# Patient Record
Sex: Female | Born: 1980
Health system: Southern US, Community
[De-identification: ages and names within clinical notes are randomized; demographics above are authoritative.]

## PROBLEM LIST (undated history)

## (undated) DIAGNOSIS — R011 Cardiac murmur, unspecified: Secondary | ICD-10-CM

## (undated) DIAGNOSIS — K219 Gastro-esophageal reflux disease without esophagitis: Secondary | ICD-10-CM

## (undated) DIAGNOSIS — N879 Dysplasia of cervix uteri, unspecified: Secondary | ICD-10-CM

## (undated) DIAGNOSIS — Z8481 Family history of carrier of genetic disease: Secondary | ICD-10-CM

## (undated) DIAGNOSIS — Z803 Family history of malignant neoplasm of breast: Secondary | ICD-10-CM

## (undated) DIAGNOSIS — Z1371 Encounter for nonprocreative screening for genetic disease carrier status: Secondary | ICD-10-CM

## (undated) DIAGNOSIS — M069 Rheumatoid arthritis, unspecified: Secondary | ICD-10-CM

## (undated) HISTORY — DX: Encounter for nonprocreative screening for genetic disease carrier status: Z13.71

## (undated) HISTORY — DX: Cardiac murmur, unspecified: R01.1

## (undated) HISTORY — DX: Family history of malignant neoplasm of breast: Z80.3

## (undated) HISTORY — DX: Family history of carrier of genetic disease: Z84.81

## (undated) HISTORY — DX: Dysplasia of cervix uteri, unspecified: N87.9

## (undated) HISTORY — PX: LEEP: SHX91

## (undated) HISTORY — DX: Rheumatoid arthritis, unspecified: M06.9

## (undated) HISTORY — DX: Gastro-esophageal reflux disease without esophagitis: K21.9

---

## 2010-01-14 HISTORY — PX: WRIST GANGLION EXCISION: SUR520

## 2018-01-08 LAB — HM PAP SMEAR: HM Pap smear: NEGATIVE

## 2018-05-18 DIAGNOSIS — M2550 Pain in unspecified joint: Secondary | ICD-10-CM | POA: Diagnosis not present

## 2018-05-18 DIAGNOSIS — Z79899 Other long term (current) drug therapy: Secondary | ICD-10-CM | POA: Diagnosis not present

## 2018-05-18 DIAGNOSIS — Z7189 Other specified counseling: Secondary | ICD-10-CM | POA: Diagnosis not present

## 2018-05-18 DIAGNOSIS — M0609 Rheumatoid arthritis without rheumatoid factor, multiple sites: Secondary | ICD-10-CM | POA: Diagnosis not present

## 2018-06-15 ENCOUNTER — Other Ambulatory Visit: Payer: Self-pay

## 2018-06-15 DIAGNOSIS — M0609 Rheumatoid arthritis without rheumatoid factor, multiple sites: Secondary | ICD-10-CM | POA: Diagnosis not present

## 2018-06-16 LAB — CBC WITH DIFFERENTIAL/PLATELET
Basophils Absolute: 0.1 10*3/uL (ref 0.0–0.2)
Basos: 1 %
EOS (ABSOLUTE): 0.3 10*3/uL (ref 0.0–0.4)
Eos: 4 %
Hematocrit: 40.1 % (ref 34.0–46.6)
Hemoglobin: 14.4 g/dL (ref 11.1–15.9)
Immature Grans (Abs): 0 10*3/uL (ref 0.0–0.1)
Immature Granulocytes: 0 %
Lymphocytes Absolute: 2.4 10*3/uL (ref 0.7–3.1)
Lymphs: 30 %
MCH: 34.6 pg — ABNORMAL HIGH (ref 26.6–33.0)
MCHC: 35.9 g/dL — ABNORMAL HIGH (ref 31.5–35.7)
MCV: 96 fL (ref 79–97)
Monocytes Absolute: 0.6 10*3/uL (ref 0.1–0.9)
Monocytes: 8 %
Neutrophils Absolute: 4.6 10*3/uL (ref 1.4–7.0)
Neutrophils: 57 %
Platelets: 227 10*3/uL (ref 150–450)
RBC: 4.16 x10E6/uL (ref 3.77–5.28)
RDW: 12.7 % (ref 11.7–15.4)
WBC: 8.1 10*3/uL (ref 3.4–10.8)

## 2018-06-16 LAB — COMPREHENSIVE METABOLIC PANEL
ALT: 12 IU/L (ref 0–32)
AST: 15 IU/L (ref 0–40)
Albumin/Globulin Ratio: 2.1 (ref 1.2–2.2)
Albumin: 4.6 g/dL (ref 3.8–4.8)
Alkaline Phosphatase: 42 IU/L (ref 39–117)
BUN/Creatinine Ratio: 16 (ref 9–23)
BUN: 12 mg/dL (ref 6–20)
Bilirubin Total: <0.2 mg/dL (ref 0.0–1.2)
CO2: 22 mmol/L (ref 20–29)
Calcium: 9.1 mg/dL (ref 8.7–10.2)
Chloride: 100 mmol/L (ref 96–106)
Creatinine, Ser: 0.75 mg/dL (ref 0.57–1.00)
GFR calc Af Amer: 118 mL/min/{1.73_m2} (ref >59)
GFR calc non Af Amer: 102 mL/min/{1.73_m2} (ref >59)
Globulin, Total: 2.2 g/dL (ref 1.5–4.5)
Glucose: 94 mg/dL (ref 65–99)
Potassium: 3.9 mmol/L (ref 3.5–5.2)
Sodium: 138 mmol/L (ref 134–144)
Total Protein: 6.8 g/dL (ref 6.0–8.5)

## 2018-08-18 DIAGNOSIS — E663 Overweight: Secondary | ICD-10-CM | POA: Diagnosis not present

## 2018-08-18 DIAGNOSIS — Z79899 Other long term (current) drug therapy: Secondary | ICD-10-CM | POA: Diagnosis not present

## 2018-08-18 DIAGNOSIS — Z6827 Body mass index (BMI) 27.0-27.9, adult: Secondary | ICD-10-CM | POA: Diagnosis not present

## 2018-08-18 DIAGNOSIS — Z9229 Personal history of other drug therapy: Secondary | ICD-10-CM | POA: Diagnosis not present

## 2018-08-18 DIAGNOSIS — Z7189 Other specified counseling: Secondary | ICD-10-CM | POA: Diagnosis not present

## 2018-08-18 DIAGNOSIS — M2550 Pain in unspecified joint: Secondary | ICD-10-CM | POA: Diagnosis not present

## 2018-08-18 DIAGNOSIS — M0609 Rheumatoid arthritis without rheumatoid factor, multiple sites: Secondary | ICD-10-CM | POA: Diagnosis not present

## 2018-11-17 DIAGNOSIS — M2550 Pain in unspecified joint: Secondary | ICD-10-CM | POA: Diagnosis not present

## 2018-11-17 DIAGNOSIS — M0609 Rheumatoid arthritis without rheumatoid factor, multiple sites: Secondary | ICD-10-CM | POA: Diagnosis not present

## 2018-11-17 DIAGNOSIS — Z79899 Other long term (current) drug therapy: Secondary | ICD-10-CM | POA: Diagnosis not present

## 2018-11-17 DIAGNOSIS — Z7189 Other specified counseling: Secondary | ICD-10-CM | POA: Diagnosis not present

## 2018-11-17 DIAGNOSIS — Z6827 Body mass index (BMI) 27.0-27.9, adult: Secondary | ICD-10-CM | POA: Diagnosis not present

## 2018-12-25 ENCOUNTER — Other Ambulatory Visit: Payer: Self-pay

## 2018-12-25 ENCOUNTER — Encounter: Payer: Self-pay | Admitting: Primary Care

## 2018-12-25 ENCOUNTER — Ambulatory Visit (INDEPENDENT_AMBULATORY_CARE_PROVIDER_SITE_OTHER): Payer: 59 | Admitting: Primary Care

## 2018-12-25 VITALS — BP 124/74 | HR 64 | Temp 97.4°F | Ht 68.25 in | Wt 176.6 lb

## 2018-12-25 DIAGNOSIS — M069 Rheumatoid arthritis, unspecified: Secondary | ICD-10-CM | POA: Diagnosis not present

## 2018-12-25 DIAGNOSIS — K219 Gastro-esophageal reflux disease without esophagitis: Secondary | ICD-10-CM | POA: Diagnosis not present

## 2018-12-25 DIAGNOSIS — Z1231 Encounter for screening mammogram for malignant neoplasm of breast: Secondary | ICD-10-CM | POA: Diagnosis not present

## 2018-12-25 DIAGNOSIS — Z803 Family history of malignant neoplasm of breast: Secondary | ICD-10-CM | POA: Insufficient documentation

## 2018-12-25 NOTE — Assessment & Plan Note (Signed)
Also in maternal aunt and maternal grandmother with early death in each member.   Will request records. Orders placed for screening mammogram and MRI as she is overdue.

## 2018-12-25 NOTE — Patient Instructions (Signed)
You will be contacted regarding your MRI and mammogram.  Please let us know if you have not been contacted within two weeks.   It was a pleasure to meet you today! Please don't hesitate to call or message me with any questions. Welcome to [facility]!

## 2018-12-25 NOTE — Assessment & Plan Note (Signed)
Diagnosed years ago, doing well on methotrexate. Continue current regimen.

## 2018-12-25 NOTE — Assessment & Plan Note (Signed)
Doing well on omeprazole daily, continue same.

## 2018-12-25 NOTE — Progress Notes (Signed)
Subjective:    Patient ID: Betty Burgess, female    DOB: 1980-09-21, 38 y.o.   MRN: [id number]  HPI  Betty Burgess is a 38 year old female who presents today to establish care and discuss the problems mentioned below. Will obtain/review records.  1) Rheumatoid Arthritis: Diagnosed two years ago. History of chronic daily pain to her hands. Currently managed on methotrexate injections once weekly, previously managed on hydroxychloroquine. Currently following with [facility], seeing Betty Burgess. Feels well managed.   2) GERD: Chronic, intermittent symptoms which include epigastric pain, esophageal reflux. Managed on omeprazole 20 mg daily with significant improvement. No breakthrough symptoms.   3) Family History of Breast Cancer: Family history in mother, maternal aunt, maternal grandmother with early death in all family members. She's undergone genetic cancer testing in [place], all negative. She's undergone MRI and mammogram every 6 months for several years, last imaging was in May 2019.  All negative. She is due now.  Review of Systems  Respiratory: Negative for shortness of breath.   Cardiovascular: Negative for chest pain.  Gastrointestinal:       Denies esophageal reflux  Musculoskeletal: Negative for arthralgias.  Neurological: Negative for dizziness and headaches.       Past Medical History:  Diagnosis Date  . GERD (gastroesophageal reflux disease)   . Heart murmur   . RA (rheumatoid arthritis) (HCC)      Social History   Socioeconomic History  . Marital status: Married    Spouse name: Not on file  . Number of children: Not on file  . Years of education: Not on file  . Highest education level: Not on file  Occupational History  . Not on file  Tobacco Use  . Smoking status: Current Every Day Smoker    Packs/day: 1.00    Types: Cigarettes  . Smokeless tobacco: Never Used  Substance and Sexual Activity  . Alcohol use: Yes  . Drug use: Not on file  .  Sexual activity: Not on file  Other Topics Concern  . Not on file  Social History Narrative   Married.   No children.   2 dogs.   Moved from [place].    Social Determinants of Health   Financial Resource Strain:   . Difficulty of Paying Living Expenses: Not on file  Food Insecurity:   . Worried About Charity fundraiser in the Last Year: Not on file  . Ran Out of Food in the Last Year: Not on file  Transportation Needs:   . Lack of Transportation (Medical): Not on file  . Lack of Transportation (Non-Medical): Not on file  Physical Activity:   . Days of Exercise per Week: Not on file  . Minutes of Exercise per Session: Not on file  Stress:   . Feeling of Stress : Not on file  Social Connections:   . Frequency of Communication with Friends and Family: Not on file  . Frequency of Social Gatherings with Friends and Family: Not on file  . Attends Religious Services: Not on file  . Active Member of Clubs or Organizations: Not on file  . Attends Archivist Meetings: Not on file  . Marital Status: Not on file  Intimate Partner Violence:   . Fear of Current or Ex-Partner: Not on file  . Emotionally Abused: Not on file  . Physically Abused: Not on file  . Sexually Abused: Not on file    Past Surgical History:  Procedure Laterality Date  .  LEEP    . WRIST GANGLION EXCISION  2012    Family History  Problem Relation Age of Onset  . Early death Mother   . Breast cancer Mother 76  . Alcohol abuse Father   . Birth defects Father   . Cancer Father   . Stroke Father   . Drug abuse Maternal Grandmother   . Hyperlipidemia Maternal Grandmother   . Breast cancer Maternal Grandmother 72  . Drug abuse Maternal Grandfather   . Hyperlipidemia Maternal Grandfather   . Arthritis Paternal Grandmother   . Drug abuse Paternal Grandmother   . Stroke Paternal Grandfather   . Breast cancer Maternal Aunt 31  . Hepatitis C Maternal Aunt     Allergies  Allergen Reactions  .  Pertussis Vaccines     Current Outpatient Medications on File Prior to Visit  Medication Sig Dispense Refill  . B-D SYRINGE/NEEDLE 1CC/25GX5/8 25G X 5/8" 1 ML MISC     . folic acid (FOLVITE) 1 MG tablet Take 1 mg by mouth.     . methotrexate 50 MG/2ML injection Inject 50 mg/m2 into the vein once a week.    . Omeprazole 20 MG TBDD Take 20 mg by mouth daily.      No current facility-administered medications on file prior to visit.    BP 124/74   Pulse 64   Temp (!) 97.4 F (36.3 C) (Temporal)   Ht 5' 8.25" (1.734 m)   Wt 176 lb 9 oz (80.1 kg)   SpO2 98%   BMI 26.65 kg/m    Objective:   Physical Exam  Constitutional: She appears well-nourished.  Cardiovascular: Normal rate and regular rhythm.  Respiratory: Effort normal and breath sounds normal.  Musculoskeletal:     Cervical back: Neck supple.  Skin: Skin is warm and dry.  Psychiatric: She has a normal mood and affect.           Assessment & Plan:

## 2019-01-12 ENCOUNTER — Encounter: Payer: Self-pay | Admitting: Primary Care

## 2019-01-14 ENCOUNTER — Encounter: Payer: Self-pay | Admitting: Primary Care

## 2019-01-18 ENCOUNTER — Ambulatory Visit
Admission: RE | Admit: 2019-01-18 | Discharge: 2019-01-18 | Disposition: A | Discharge status: Home / Self Care | Payer: 59 | Source: Ambulatory Visit | Attending: Primary Care | Admitting: Primary Care

## 2019-01-18 DIAGNOSIS — Z1231 Encounter for screening mammogram for malignant neoplasm of breast: Secondary | ICD-10-CM | POA: Insufficient documentation

## 2019-01-18 DIAGNOSIS — Z803 Family history of malignant neoplasm of breast: Secondary | ICD-10-CM | POA: Diagnosis not present

## 2019-01-18 IMAGING — MG DIGITAL SCREENING BILAT W/ TOMO W/ CAD
8 series · 8 of 24 positions shown · non-contrast
Comparison: Previous exam(s).

CLINICAL DATA: Screening.

EXAM:
DIGITAL SCREENING BILATERAL MAMMOGRAM WITH TOMO AND CAD

[R MLO synth-2D]
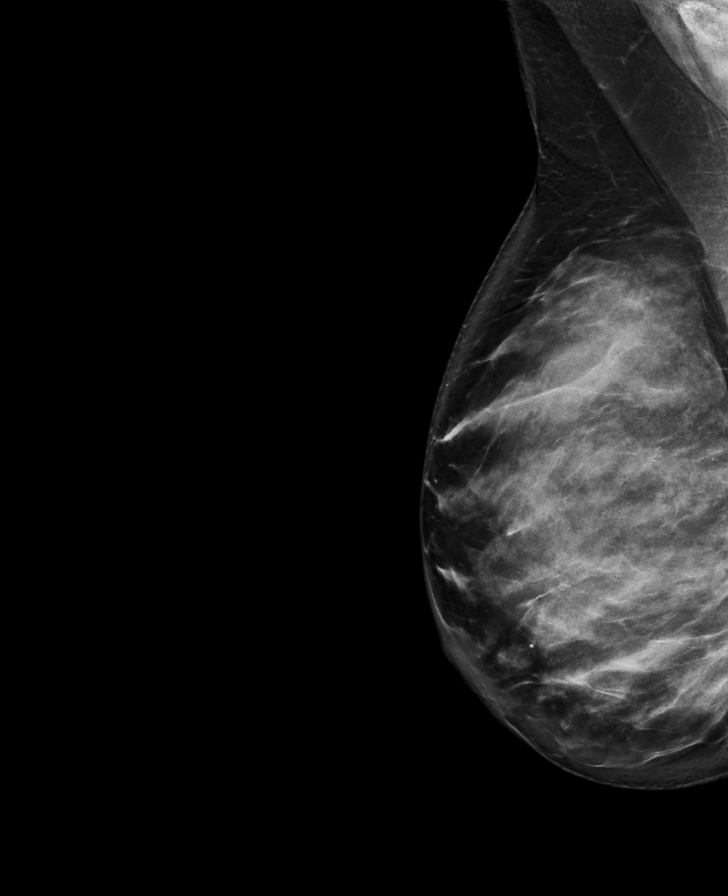

[L MLO synth-2D]
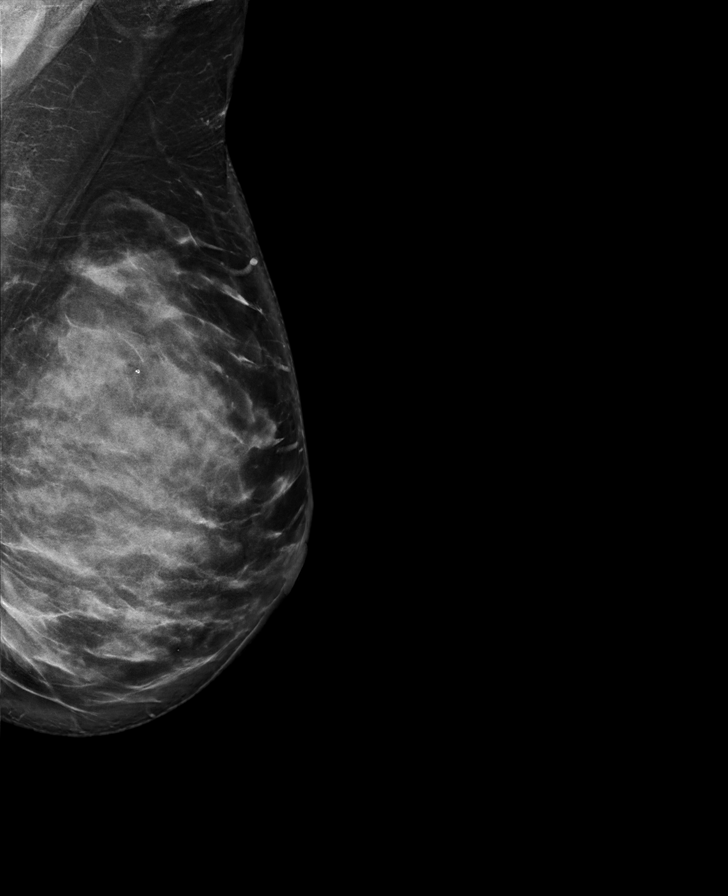

[L CC synth-2D]
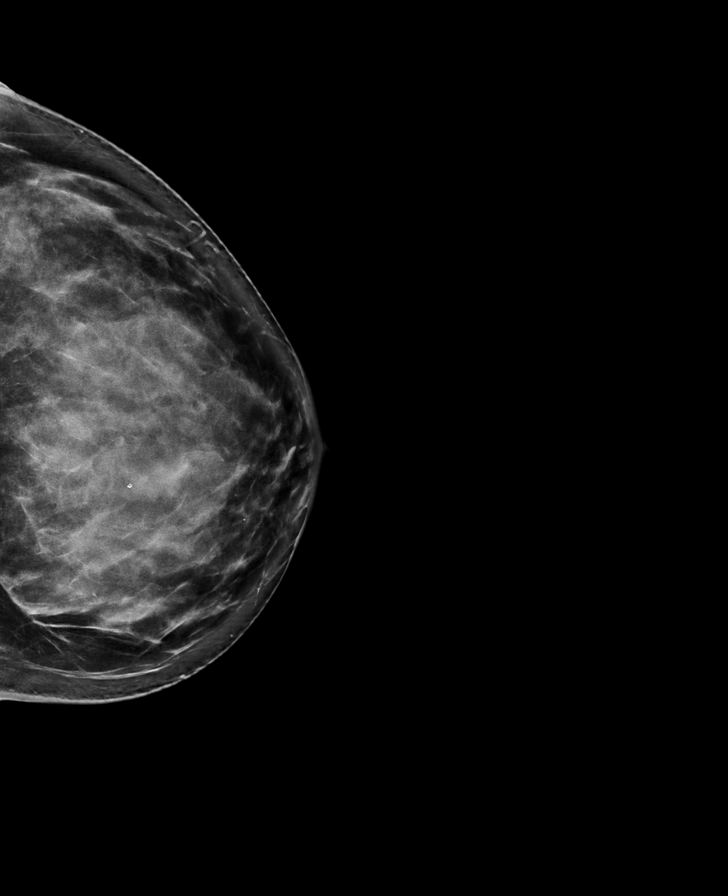

[R CC synth-2D]
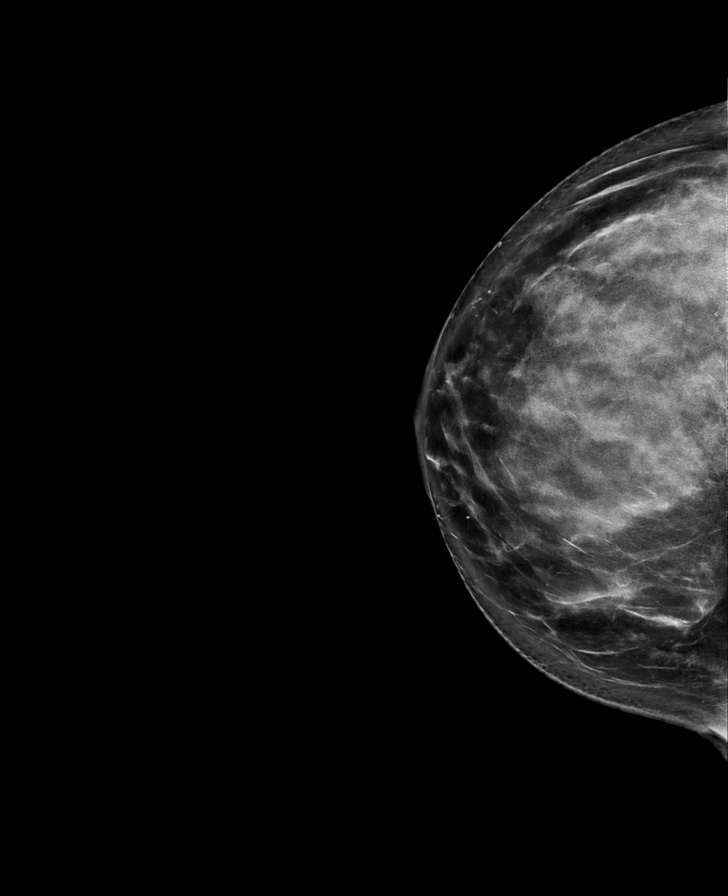

[L CC tomo · tomo slice 46/91.0]
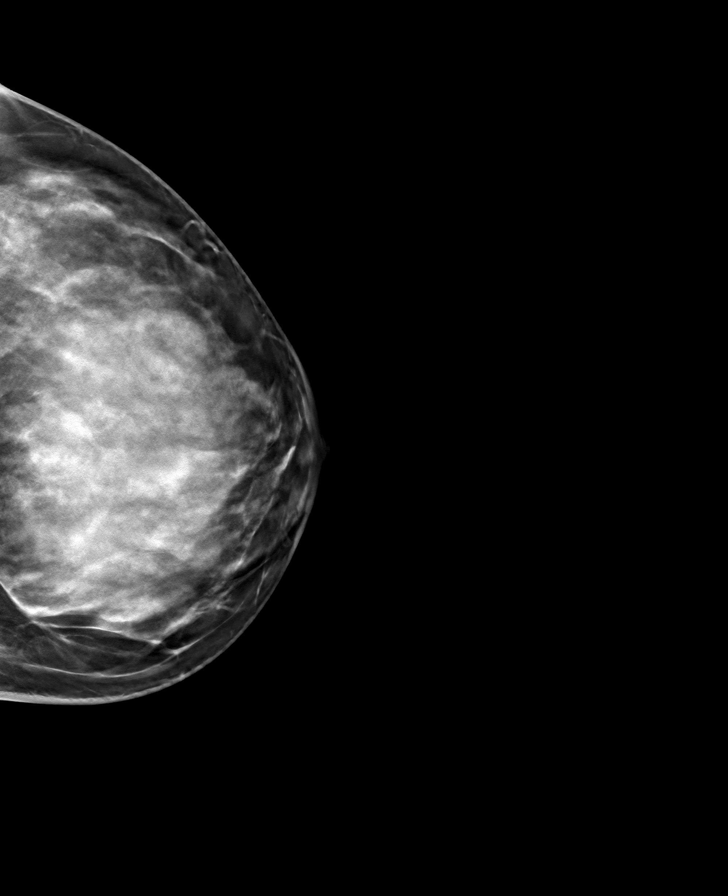

[L MLO tomo · tomo slice 47/92.0]
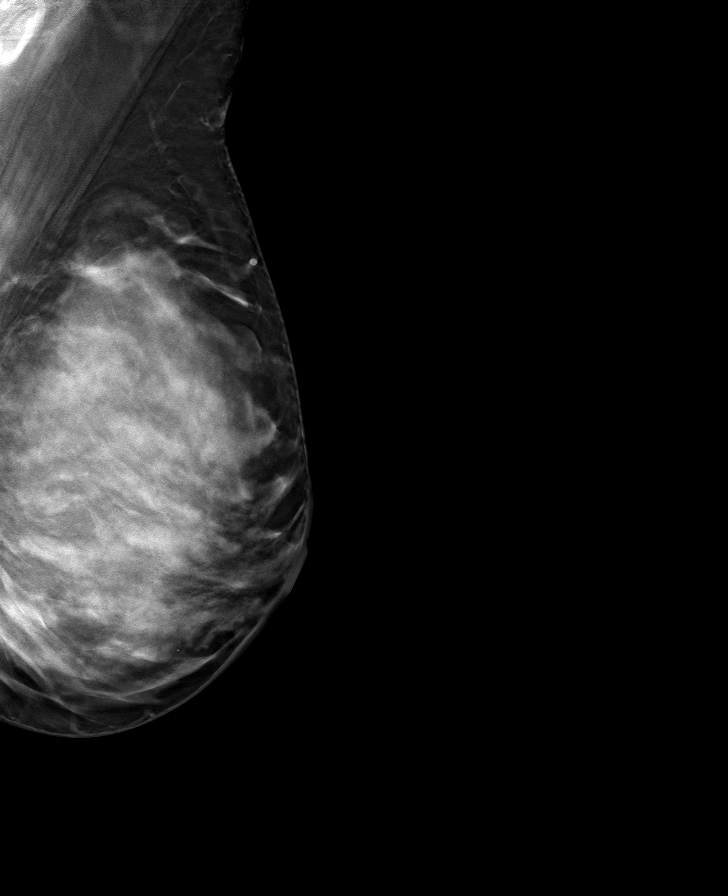

[R MLO tomo · tomo slice 49/98.0]
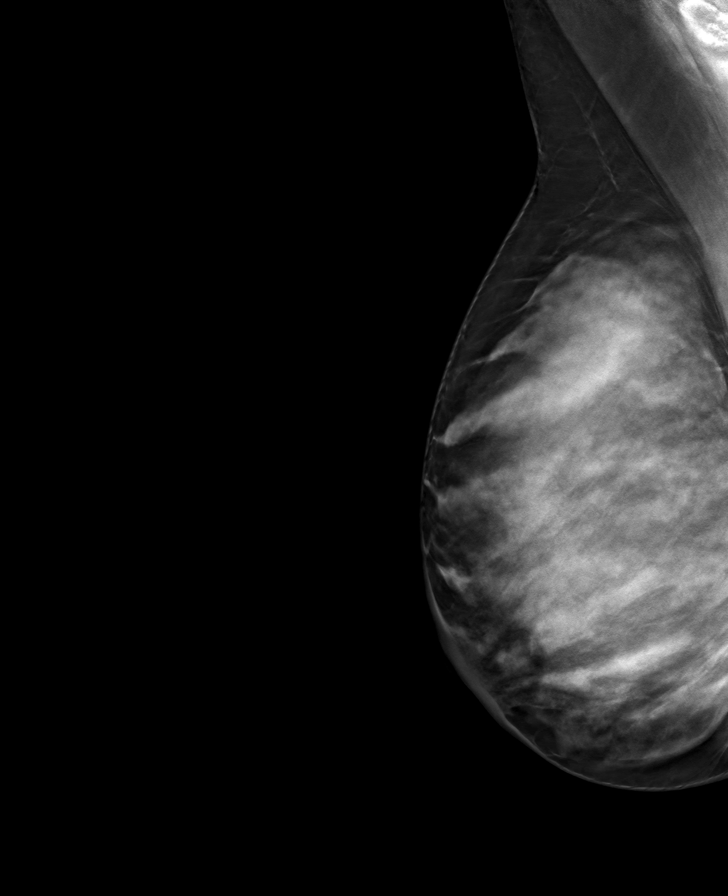

[R CC tomo · tomo slice 47/92.0]
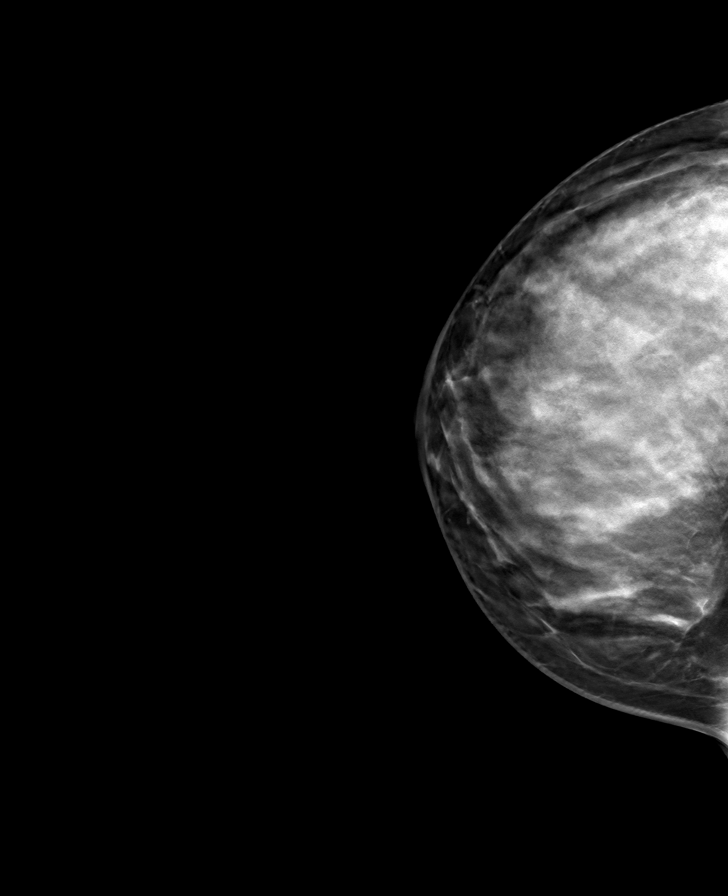

[8 of 24 positions shown; findings below may reference images not displayed]

ACR Breast Density Category d: The breast tissue is extremely dense,
which lowers the sensitivity of mammography
FINDINGS: There are no findings suspicious for malignancy. Images were
processed with CAD.
IMPRESSION: No mammographic evidence of malignancy. A result letter of this
screening mammogram will be mailed directly to the patient.

RECOMMENDATION:
Screening mammogram at age 40. (Code:[48])

BI-RADS CATEGORY  1: Negative.

## 2019-02-17 DIAGNOSIS — M2550 Pain in unspecified joint: Secondary | ICD-10-CM | POA: Diagnosis not present

## 2019-02-17 DIAGNOSIS — Z7189 Other specified counseling: Secondary | ICD-10-CM | POA: Diagnosis not present

## 2019-02-17 DIAGNOSIS — Z79899 Other long term (current) drug therapy: Secondary | ICD-10-CM | POA: Diagnosis not present

## 2019-02-17 DIAGNOSIS — M0609 Rheumatoid arthritis without rheumatoid factor, multiple sites: Secondary | ICD-10-CM | POA: Diagnosis not present

## 2019-02-17 DIAGNOSIS — Z6827 Body mass index (BMI) 27.0-27.9, adult: Secondary | ICD-10-CM | POA: Diagnosis not present

## 2019-05-10 ENCOUNTER — Encounter: Payer: Self-pay | Admitting: Primary Care

## 2019-05-10 ENCOUNTER — Ambulatory Visit (INDEPENDENT_AMBULATORY_CARE_PROVIDER_SITE_OTHER): Payer: 59 | Admitting: Primary Care

## 2019-05-10 VITALS — BP 120/70 | HR 60 | Temp 95.7°F | Ht 68.25 in | Wt 172.1 lb

## 2019-05-10 DIAGNOSIS — Z803 Family history of malignant neoplasm of breast: Secondary | ICD-10-CM

## 2019-05-10 DIAGNOSIS — Z833 Family history of diabetes mellitus: Secondary | ICD-10-CM | POA: Diagnosis not present

## 2019-05-10 DIAGNOSIS — Z0000 Encounter for general adult medical examination without abnormal findings: Secondary | ICD-10-CM

## 2019-05-10 DIAGNOSIS — K219 Gastro-esophageal reflux disease without esophagitis: Secondary | ICD-10-CM | POA: Diagnosis not present

## 2019-05-10 DIAGNOSIS — M069 Rheumatoid arthritis, unspecified: Secondary | ICD-10-CM | POA: Diagnosis not present

## 2019-05-10 MED ORDER — FAMOTIDINE 20 MG PO TABS: 20.0000 mg | ORAL_TABLET | Freq: Every day | ORAL | 0 refills | Status: DC

## 2019-05-10 NOTE — Assessment & Plan Note (Signed)
Improved on methotrexate, following with rheumatology.

## 2019-05-10 NOTE — Assessment & Plan Note (Signed)
Intermittent symptoms, mostly occurring during menstrual cycle. No need for PPI given intermittent symptoms, especially with methotrexate.  Rx for famotidine provide to use as needed. She will update.

## 2019-05-10 NOTE — Progress Notes (Signed)
Subjective:    Patient ID: Betty Burgess, female    DOB: [birth year], 39 y.o.   MRN: [id number]  HPI  This visit occurred during the SARS-CoV-2 public health emergency.  Safety protocols were in place, including screening questions prior to the visit, additional usage of staff PPE, and extensive cleaning of exam room while observing appropriate contact time as indicated for disinfecting solutions.   Betty Burgess is a 39 year old female who presents today for complete physical.  Immunizations: -Tetanus: Unsure  -Influenza: Complete this season  -Covid-19: Completed series   Diet: She endorses a fair diet.  Exercise: She is running 2-3 days weekly, walking during lunch  Eye exam: Completed 1-2 years  Dental exam: Completes regularly   Pap Smear: Completed in 2019 Mammogram: Scheduled for MRI July 2021  BP Readings from Last 3 Encounters:  05/10/19 120/70  12/25/18 124/74    Wt Readings from Last 3 Encounters:  05/10/19 172 lb 1 oz (78 kg)  12/25/18 176 lb 9 oz (80.1 kg)      Review of Systems  Constitutional: Negative for unexpected weight change.  HENT: Negative for rhinorrhea.   Respiratory: Negative for cough and shortness of breath.   Cardiovascular: Negative for chest pain.  Gastrointestinal: Negative for constipation and diarrhea.       GERD  Genitourinary: Negative for difficulty urinating.       IUD in place  Musculoskeletal: Positive for arthralgias.  Skin: Negative for rash.  Allergic/Immunologic: Negative for environmental allergies.  Neurological: Negative for dizziness, numbness and headaches.  Psychiatric/Behavioral: The patient is not nervous/anxious.        Past Medical History:  Diagnosis Date  . GERD (gastroesophageal reflux disease)   . Heart murmur   . RA (rheumatoid arthritis) (HCC)      Social History   Socioeconomic History  . Marital status: Married    Spouse name: Not on file  . Number of children: Not on file  . Years of  education: Not on file  . Highest education level: Not on file  Occupational History  . Not on file  Tobacco Use  . Smoking status: Current Every Day Smoker    Packs/day: 1.00    Types: Cigarettes  . Smokeless tobacco: Never Used  Substance and Sexual Activity  . Alcohol use: Yes  . Drug use: Not on file  . Sexual activity: Not on file  Other Topics Concern  . Not on file  Social History Narrative   Married.   No children.   2 dogs.   Moved from [place].    Social Determinants of Health   Financial Resource Strain:   . Difficulty of Paying Living Expenses:   Food Insecurity:   . Worried About Charity fundraiser in the Last Year:   . Arboriculturist in the Last Year:   Transportation Needs:   . Film/video editor (Medical):   Marland Kitchen Lack of Transportation (Non-Medical):   Physical Activity:   . Days of Exercise per Week:   . Minutes of Exercise per Session:   Stress:   . Feeling of Stress :   Social Connections:   . Frequency of Communication with Friends and Family:   . Frequency of Social Gatherings with Friends and Family:   . Attends Religious Services:   . Active Member of Clubs or Organizations:   . Attends Archivist Meetings:   Marland Kitchen Marital Status:   Intimate Partner Violence:   .  Fear of Current or Ex-Partner:   . Emotionally Abused:   Marland Kitchen Physically Abused:   . Sexually Abused:     Past Surgical History:  Procedure Laterality Date  . LEEP    . WRIST GANGLION EXCISION  2012    Family History  Problem Relation Age of Onset  . Early death Mother   . Breast cancer Mother 75  . Alcohol abuse Father   . Birth defects Father   . Cancer Father   . Stroke Father   . Hyperlipidemia Maternal Grandmother   . Breast cancer Maternal Grandmother 57  . Diabetes Maternal Grandmother   . Hyperlipidemia Maternal Grandfather   . Diabetes Maternal Grandfather   . Arthritis Paternal Grandmother   . Diabetes Paternal Grandmother   . Stroke Paternal  Grandfather   . Breast cancer Maternal Aunt 92  . Hepatitis C Maternal Aunt     Allergies  Allergen Reactions  . Pertussis Vaccines     Current Outpatient Medications on File Prior to Visit  Medication Sig Dispense Refill  . B-D SYRINGE/NEEDLE 1CC/25GX5/8 25G X 5/8" 1 ML MISC     . folic acid (FOLVITE) 1 MG tablet Take 1 mg by mouth.     . methotrexate 50 MG/2ML injection Inject 50 mg/m2 into the vein once a week.    . Omeprazole 20 MG TBDD Take 20 mg by mouth daily.      No current facility-administered medications on file prior to visit.    BP 120/70   Pulse 60   Temp (!) 95.7 F (35.4 C) (Temporal)   Ht 5' 8.25" (1.734 m)   Wt 172 lb 1 oz (78 kg)   SpO2 99%   BMI 25.97 kg/m    Objective:   Physical Exam  Constitutional: She is oriented to person, place, and time. She appears well-nourished.  HENT:  Right Ear: Tympanic membrane and ear canal normal.  Left Ear: Tympanic membrane and ear canal normal.  Mouth/Throat: Oropharynx is clear and moist.  Eyes: Pupils are equal, round, and reactive to light. EOM are normal.  Cardiovascular: Normal rate and regular rhythm.  Respiratory: Effort normal and breath sounds normal.  GI: Soft. Bowel sounds are normal. There is no abdominal tenderness.  Musculoskeletal:        General: Normal range of motion.     Cervical back: Neck supple.  Neurological: She is alert and oriented to person, place, and time. No cranial nerve deficit.  Reflex Scores:      Patellar reflexes are 2+ on the right side and 2+ on the left side. Skin: Skin is warm and dry.  Psychiatric: She has a normal mood and affect.           Assessment & Plan:

## 2019-05-10 NOTE — Patient Instructions (Addendum)
Complete lab work when due.  Continue exercising. You should be getting 150 minutes of moderate intensity exercise weekly.  Continue to work on a healthy diet. Ensure you are consuming 64 ounces of water daily.  You will be contacted regarding your referral to GYN.  Please let us know if you have not been contacted within two weeks.   Take the famotidine daily when needed for heartburn. Update me if this is ineffective.  It was a pleasure to see you today!   Preventive Care 2-40 Years Old, Female Preventive care refers to visits with your health care provider and lifestyle choices that can promote health and wellness. This includes:  A yearly physical exam. This may also be called an annual well check.  Regular dental visits and eye exams.  Immunizations.  Screening for certain conditions.  Healthy lifestyle choices, such as eating a healthy diet, getting regular exercise, not using drugs or products that contain nicotine and tobacco, and limiting alcohol use. What can I expect for my preventive care visit? Physical exam Your health care provider will check your:  Height and weight. This may be used to calculate body mass index (BMI), which tells if you are at a healthy weight.  Heart rate and blood pressure.  Skin for abnormal spots. Counseling Your health care provider may ask you questions about your:  Alcohol, tobacco, and drug use.  Emotional well-being.  Home and relationship well-being.  Sexual activity.  Eating habits.  Work and work Statistician.  Method of birth control.  Menstrual cycle.  Pregnancy history. What immunizations do I need?  Influenza (flu) vaccine  This is recommended every year. Tetanus, diphtheria, and pertussis (Tdap) vaccine  You may need a Td booster every 10 years. Varicella (chickenpox) vaccine  You may need this if you have not been vaccinated. Human papillomavirus (HPV) vaccine  If recommended by your health care  provider, you may need three doses over 6 months. Measles, mumps, and rubella (MMR) vaccine  You may need at least one dose of MMR. You may also need a second dose. Meningococcal conjugate (MenACWY) vaccine  One dose is recommended if you are age 39-21 years and a first-year college student living in a residence hall, or if you have one of several medical conditions. You may also need additional booster doses. Pneumococcal conjugate (PCV13) vaccine  You may need this if you have certain conditions and were not previously vaccinated. Pneumococcal polysaccharide (PPSV23) vaccine  You may need one or two doses if you smoke cigarettes or if you have certain conditions. Hepatitis A vaccine  You may need this if you have certain conditions or if you travel or work in places where you may be exposed to hepatitis A. Hepatitis B vaccine  You may need this if you have certain conditions or if you travel or work in places where you may be exposed to hepatitis B. Haemophilus influenzae type b (Hib) vaccine  You may need this if you have certain conditions. You may receive vaccines as individual doses or as more than one vaccine together in one shot (combination vaccines). Talk with your health care provider about the risks and benefits of combination vaccines. What tests do I need?  Blood tests  Lipid and cholesterol levels. These may be checked every 5 years starting at age 39.  Hepatitis C test.  Hepatitis B test. Screening  Diabetes screening. This is done by checking your blood sugar (glucose) after you have not eaten for a while (fasting).  Sexually transmitted disease (STD) testing.  BRCA-related cancer screening. This may be done if you have a family history of breast, ovarian, tubal, or peritoneal cancers.  Pelvic exam and Pap test. This may be done every 3 years starting at age 39. Starting at age 39, this may be done every 5 years if you have a Pap test in combination with an  HPV test. Talk with your health care provider about your test results, treatment options, and if necessary, the need for more tests. Follow these instructions at home: Eating and drinking   Eat a diet that includes fresh fruits and vegetables, whole grains, lean protein, and low-fat dairy.  Take vitamin and mineral supplements as recommended by your health care provider.  Do not drink alcohol if: ? Your health care provider tells you not to drink. ? You are pregnant, may be pregnant, or are planning to become pregnant.  If you drink alcohol: ? Limit how much you have to 0-1 drink a day. ? Be aware of how much alcohol is in your drink. In the [place] one drink equals one 12 oz bottle of beer (355 mL), one 5 oz glass of wine (148 mL), or one 1 oz glass of hard liquor (44 mL). Lifestyle  Take daily care of your teeth and gums.  Stay active. Exercise for at least 30 minutes on 5 or more days each week.  Do not use any products that contain nicotine or tobacco, such as cigarettes, e-cigarettes, and chewing tobacco. If you need help quitting, ask your health care provider.  If you are sexually active, practice safe sex. Use a condom or other form of birth control (contraception) in order to prevent pregnancy and STIs (sexually transmitted infections). If you plan to become pregnant, see your health care provider for a preconception visit. What's next?  Visit your health care provider once a year for a well check visit.  Ask your health care provider how often you should have your eyes and teeth checked.  Stay up to date on all vaccines. This information is not intended to replace advice given to you by your health care provider. Make sure you discuss any questions you have with your health care provider. Document Revised: 09/11/2017 Document Reviewed: 09/11/2017 Elsevier Patient Education  2020 [employer].

## 2019-05-10 NOTE — Assessment & Plan Note (Signed)
Tetanus UTD, patient will look at records.  Pap smear due, referral placed to GYN per patient request. Encouraged a healthy diet, regular exercise.  Exam today unremarkable. Labs pending.

## 2019-05-10 NOTE — Assessment & Plan Note (Signed)
Mammogram UTD, MR of breasts pending for July 2021.

## 2019-05-17 ENCOUNTER — Other Ambulatory Visit: Payer: Self-pay

## 2019-05-17 ENCOUNTER — Other Ambulatory Visit (INDEPENDENT_AMBULATORY_CARE_PROVIDER_SITE_OTHER): Payer: 59

## 2019-05-17 DIAGNOSIS — Z0000 Encounter for general adult medical examination without abnormal findings: Secondary | ICD-10-CM

## 2019-05-17 DIAGNOSIS — Z7689 Persons encountering health services in other specified circumstances: Secondary | ICD-10-CM | POA: Diagnosis not present

## 2019-05-17 DIAGNOSIS — Z833 Family history of diabetes mellitus: Secondary | ICD-10-CM

## 2019-05-17 LAB — LIPID PANEL
Cholesterol: 171 mg/dL (ref 0–200)
HDL: 45.5 mg/dL (ref >39.00)
LDL Cholesterol: 110 mg/dL — ABNORMAL HIGH (ref 0–99)
NonHDL: 125.43
Total CHOL/HDL Ratio: 4
Triglycerides: 75 mg/dL (ref 0.0–149.0)
VLDL: 15 mg/dL (ref 0.0–40.0)

## 2019-05-17 LAB — HEMOGLOBIN A1C: Hgb A1c MFr Bld: 5.2 % (ref 4.6–6.5)

## 2019-05-18 LAB — CBC WITH DIFFERENTIAL/PLATELET
Basophils Absolute: 0.1 10*3/uL (ref 0.0–0.2)
Basos: 1 %
EOS (ABSOLUTE): 0.1 10*3/uL (ref 0.0–0.4)
Eos: 3 %
Hematocrit: 43 % (ref 34.0–46.6)
Hemoglobin: 14.9 g/dL (ref 11.1–15.9)
Immature Grans (Abs): 0 10*3/uL (ref 0.0–0.1)
Immature Granulocytes: 0 %
Lymphocytes Absolute: 1.8 10*3/uL (ref 0.7–3.1)
Lymphs: 32 %
MCH: 34.4 pg — ABNORMAL HIGH (ref 26.6–33.0)
MCHC: 34.7 g/dL (ref 31.5–35.7)
MCV: 99 fL — ABNORMAL HIGH (ref 79–97)
Monocytes Absolute: 0.6 10*3/uL (ref 0.1–0.9)
Monocytes: 10 %
Neutrophils Absolute: 3.1 10*3/uL (ref 1.4–7.0)
Neutrophils: 54 %
Platelets: 219 10*3/uL (ref 150–450)
RBC: 4.33 x10E6/uL (ref 3.77–5.28)
RDW: 12.3 % (ref 11.7–15.4)
WBC: 5.7 10*3/uL (ref 3.4–10.8)

## 2019-05-18 LAB — COMPREHENSIVE METABOLIC PANEL
ALT: 8 IU/L (ref 0–32)
AST: 11 IU/L (ref 0–40)
Albumin/Globulin Ratio: 2.4 — ABNORMAL HIGH (ref 1.2–2.2)
Albumin: 4.4 g/dL (ref 3.8–4.8)
Alkaline Phosphatase: 39 IU/L (ref 39–117)
BUN/Creatinine Ratio: 12 (ref 9–23)
BUN: 9 mg/dL (ref 6–20)
Bilirubin Total: 0.3 mg/dL (ref 0.0–1.2)
CO2: 22 mmol/L (ref 20–29)
Calcium: 8.7 mg/dL (ref 8.7–10.2)
Chloride: 105 mmol/L (ref 96–106)
Creatinine, Ser: 0.76 mg/dL (ref 0.57–1.00)
GFR calc Af Amer: 115 mL/min/{1.73_m2} (ref >59)
GFR calc non Af Amer: 100 mL/min/{1.73_m2} (ref >59)
Globulin, Total: 1.8 g/dL (ref 1.5–4.5)
Glucose: 102 mg/dL — ABNORMAL HIGH (ref 65–99)
Potassium: 4.2 mmol/L (ref 3.5–5.2)
Sodium: 139 mmol/L (ref 134–144)
Total Protein: 6.2 g/dL (ref 6.0–8.5)

## 2019-05-24 ENCOUNTER — Encounter: Payer: 59 | Admitting: Obstetrics

## 2019-06-01 ENCOUNTER — Encounter: Payer: Self-pay | Admitting: Obstetrics and Gynecology

## 2019-06-01 NOTE — Patient Instructions (Addendum)
I value your feedback and entrusting us with your care. If you get a [facility] patient survey, I would appreciate you taking the time to let us know about your experience today. Thank you!  As of December 24, 2018, your lab results will be released to your MyChart immediately, before I even have a chance to see them. Please give me time to review them and contact you if there are any abnormalities. Thank you for your patience.   [facility]: [phone]  [facility]: [phone]  

## 2019-06-01 NOTE — Progress Notes (Signed)
PCP:  Pleas Koch, NP   Chief Complaint  Patient presents with   Gynecologic Exam     HPI:      [name] is a 39 y.o. No obstetric history on file. whose LMP was No LMP recorded. (Menstrual status: IUD)., presents today for her NP annual examination.  Her menses are absent with IUD. Has occas spotting, no dysmen.  Sex activity: single partner, contraception - IUD. Mirena placed less than 6 yrs ago. Pt plans to check on insertion date, done in [place].  Last Pap: 01/02/18  Results were: no abnormalities /neg HPV DNA . S/P LEEP less than 10 yrs ago. Had neg margins. Does regular paps and normal paps since.  Hx of STDs: HPV  Last mammogram: 01/18/19  Results were: normal--routine follow-up in 12 months. Does yearly scr breast MRI and has appt 7/21.  There is a FH of breast cancer in her mom, MGM, and mat aunt. Genetic testing has been done. Her MGM was BRCA 1 positive. Pt is 24 gene panel neg. Still at increased risk based on FH per GC.  There is no FH of ovarian cancer. The patient does not do self-breast exams.  Tobacco use: smokes 1/2 ppd, down from 1 ppd Alcohol use: none No drug use.  Exercise: very active  She does get adequate calcium but not Vitamin D in her diet. Labs with PCP   Past Medical History:  Diagnosis Date   BRCA negative    24 gene panel neg   Cervical dysplasia    Family history of BRCA1 gene positive    MGM   Family history of breast cancer    GERD (gastroesophageal reflux disease)    Heart murmur    RA (rheumatoid arthritis) ([place])     Past Surgical History:  Procedure Laterality Date   LEEP  ~2015   WRIST GANGLION EXCISION  2012    Family History  Problem Relation Age of Onset   Early death Mother    Breast cancer Mother 90       BRCA neg   Alcohol abuse Father    Birth defects Father    Cancer Father    Stroke Father    Hyperlipidemia Maternal Grandmother    Breast cancer Maternal Grandmother 31        BRCA 1 pos   Diabetes Maternal Grandmother    Hyperlipidemia Maternal Grandfather    Diabetes Maternal Grandfather    Arthritis Paternal Grandmother    Diabetes Paternal Grandmother    Stroke Paternal Grandfather    Breast cancer Maternal Aunt 3       BRCA neg   Hepatitis C Maternal Aunt     Social History   Socioeconomic History   Marital status: Married    Spouse name: Not on file   Number of children: Not on file   Years of education: Not on file   Highest education level: Not on file  Occupational History   Not on file  Tobacco Use   Smoking status: Current Every Day Smoker    Packs/day: 1.00    Types: Cigarettes   Smokeless tobacco: Never Used  Substance and Sexual Activity   Alcohol use: Yes   Drug use: Never   Sexual activity: Yes    Birth control/protection: I.U.D.    Comment: Mirena  Other Topics Concern   Not on file  Social History Narrative   Married.   No children.   2 dogs.  Moved from [place].    Social Determinants of Health   Financial Resource Strain:    Difficulty of Paying Living Expenses:   Food Insecurity:    Worried About Charity fundraiser in the Last Year:    Arboriculturist in the Last Year:   Transportation Needs:    Film/video editor (Medical):    Lack of Transportation (Non-Medical):   Physical Activity:    Days of Exercise per Week:    Minutes of Exercise per Session:   Stress:    Feeling of Stress :   Social Connections:    Frequency of Communication with Friends and Family:    Frequency of Social Gatherings with Friends and Family:    Attends Religious Services:    Active Member of Clubs or Organizations:    Attends Music therapist:    Marital Status:   Intimate Partner Violence:    Fear of Current or Ex-Partner:    Emotionally Abused:    Physically Abused:    Sexually Abused:      Current Outpatient Medications:    B-D SYRINGE/NEEDLE 1CC/25GX5/8 25G X  5/8" 1 ML MISC, , Disp: , Rfl:    famotidine (PEPCID) 20 MG tablet, Take 1 tablet (20 mg total) by mouth daily. As needed for heartburn., Disp: 90 tablet, Rfl: 0   folic acid (FOLVITE) 1 MG tablet, Take 1 mg by mouth. , Disp: , Rfl:    levonorgestrel (MIRENA) 20 MCG/24HR IUD, 1 each by Intrauterine route once. Pt thinks insertion date was in 2017 or 2018, Disp: , Rfl:    methotrexate 50 MG/2ML injection, Inject 50 mg/m2 into the vein once a week., Disp: , Rfl:    ROS:  Review of Systems  Constitutional: Negative for fatigue, fever and unexpected weight change.  Respiratory: Negative for cough, shortness of breath and wheezing.   Cardiovascular: Negative for chest pain, palpitations and leg swelling.  Gastrointestinal: Negative for blood in stool, constipation, diarrhea, nausea and vomiting.  Endocrine: Negative for cold intolerance, heat intolerance and polyuria.  Genitourinary: Negative for dyspareunia, dysuria, flank pain, frequency, genital sores, hematuria, menstrual problem, pelvic pain, urgency, vaginal bleeding, vaginal discharge and vaginal pain.  Musculoskeletal: Negative for back pain, joint swelling and myalgias.  Skin: Negative for rash.  Neurological: Negative for dizziness, syncope, light-headedness, numbness and headaches.  Hematological: Negative for adenopathy.  Psychiatric/Behavioral: Negative for agitation, confusion, sleep disturbance and suicidal ideas. The patient is not nervous/anxious.   BREAST: No symptoms   Objective: BP 98/80    Ht '5\' 9"'  (1.753 m)    Wt 173 lb (78.5 kg)    BMI 25.55 kg/m    Physical Exam Constitutional:      Appearance: She is well-developed.  Genitourinary:     Vulva, vagina, cervix, uterus, right adnexa and left adnexa normal.     No vulval lesion or tenderness noted.     No vaginal discharge, erythema or tenderness.     No cervical polyp.     IUD strings visualized.     Uterus is not enlarged or tender.     No right or left  adnexal mass present.     Right adnexa not tender.     Left adnexa not tender.  Neck:     Thyroid: No thyromegaly.  Cardiovascular:     Rate and Rhythm: Normal rate and regular rhythm.     Heart sounds: Normal heart sounds. No murmur.  Pulmonary:     Effort:  Pulmonary effort is normal.     Breath sounds: Normal breath sounds.  Chest:     Breasts:        Right: No mass, nipple discharge, skin change or tenderness.        Left: No mass, nipple discharge, skin change or tenderness.  Abdominal:     Palpations: Abdomen is soft.     Tenderness: There is no abdominal tenderness. There is no guarding.  Musculoskeletal:        General: Normal range of motion.     Cervical back: Normal range of motion.  Neurological:     General: No focal deficit present.     Mental Status: She is alert and oriented to person, place, and time.     Cranial Nerves: No cranial nerve deficit.  Skin:    General: Skin is warm and dry.  Psychiatric:        Mood and Affect: Mood normal.        Behavior: Behavior normal.        Thought Content: Thought content normal.        Judgment: Judgment normal.  Vitals reviewed.     Assessment/Plan: Encounter for annual routine gynecological examination  Cervical cancer screening - Plan: Cytology - PAP  Screening for HPV (human papillomavirus) - Plan: Cytology - PAP  Cervical dysplasia - Plan: Cytology - PAP; repeat pap today.  Encounter for routine checking of intrauterine contraceptive device (IUD)--IUD strings in place. Pt to confirm insertion date. Good for 25yr  Family history of breast cancer--MGM is BRCA 1 pos; pt is BRCA neg. Does yearly mammos and scr breast MRI. Add Vit D supp.             GYN counsel breast self exam, mammography screening, adequate intake of calcium and vitamin D, diet and exercise, tobacco cessation     F/U  Return in about 1 year (around 06/01/2020).  Addalynne Golding B. Jaan Fischel, PA-C 06/02/2019 10:45 AM

## 2019-06-02 ENCOUNTER — Other Ambulatory Visit: Payer: Self-pay

## 2019-06-02 ENCOUNTER — Encounter: Payer: Self-pay | Admitting: Obstetrics and Gynecology

## 2019-06-02 ENCOUNTER — Other Ambulatory Visit (HOSPITAL_COMMUNITY)
Admission: RE | Admit: 2019-06-02 | Discharge: 2019-06-02 | Disposition: A | Discharge status: Home / Self Care | Payer: 59 | Source: Ambulatory Visit | Attending: Obstetrics | Admitting: Obstetrics

## 2019-06-02 ENCOUNTER — Ambulatory Visit (INDEPENDENT_AMBULATORY_CARE_PROVIDER_SITE_OTHER): Payer: 59 | Admitting: Obstetrics and Gynecology

## 2019-06-02 VITALS — BP 98/80 | Ht 69.0 in | Wt 173.0 lb

## 2019-06-02 DIAGNOSIS — Z803 Family history of malignant neoplasm of breast: Secondary | ICD-10-CM | POA: Diagnosis not present

## 2019-06-02 DIAGNOSIS — Z01419 Encounter for gynecological examination (general) (routine) without abnormal findings: Secondary | ICD-10-CM

## 2019-06-02 DIAGNOSIS — N879 Dysplasia of cervix uteri, unspecified: Secondary | ICD-10-CM | POA: Insufficient documentation

## 2019-06-02 DIAGNOSIS — Z1151 Encounter for screening for human papillomavirus (HPV): Secondary | ICD-10-CM | POA: Diagnosis not present

## 2019-06-02 DIAGNOSIS — Z30431 Encounter for routine checking of intrauterine contraceptive device: Secondary | ICD-10-CM | POA: Diagnosis not present

## 2019-06-02 DIAGNOSIS — Z124 Encounter for screening for malignant neoplasm of cervix: Secondary | ICD-10-CM | POA: Insufficient documentation

## 2019-06-03 LAB — CYTOLOGY - PAP
Adequacy: ABSENT
Comment: NEGATIVE
Diagnosis: NEGATIVE
High risk HPV: NEGATIVE

## 2019-07-10 ENCOUNTER — Encounter: Payer: Self-pay | Admitting: Obstetrics and Gynecology

## 2019-07-20 ENCOUNTER — Ambulatory Visit: Payer: 59

## 2019-07-26 ENCOUNTER — Ambulatory Visit
Admission: RE | Admit: 2019-07-26 | Discharge: 2019-07-26 | Disposition: A | Discharge status: Home / Self Care | Payer: 59 | Source: Ambulatory Visit | Attending: Primary Care | Admitting: Primary Care

## 2019-07-26 ENCOUNTER — Other Ambulatory Visit: Payer: Self-pay

## 2019-07-26 DIAGNOSIS — Z1231 Encounter for screening mammogram for malignant neoplasm of breast: Secondary | ICD-10-CM | POA: Diagnosis not present

## 2019-07-26 DIAGNOSIS — N6002 Solitary cyst of left breast: Secondary | ICD-10-CM | POA: Diagnosis not present

## 2019-07-26 DIAGNOSIS — Z803 Family history of malignant neoplasm of breast: Secondary | ICD-10-CM | POA: Diagnosis not present

## 2019-07-26 DIAGNOSIS — N6001 Solitary cyst of right breast: Secondary | ICD-10-CM | POA: Diagnosis not present

## 2019-07-26 IMAGING — MR MR BREAST BILAT WO/W CM
2 of 9 series · 6 of 48 positions shown · IV contrast (gadavist)
Comparison: [DATE] MR, [DATE] mammogram and prior studies

CLINICAL DATA: 38-year-old female with high lifetime risk for
developing breast cancer for screening breast MRI.

LABS:  None performed today
EXAM:
BILATERAL BREAST MRI WITH AND WITHOUT CONTRAST
TECHNIQUE: Multiplanar, multisequence MR images of both breasts were obtained
prior to and following the intravenous administration of 7 ml of
Gadavist

[Series 2: T1 · axial · B · 1.5mm · 1.02mm/px · z∈[-114,+52]mm · 5 of 112 slices shown]
[im 1/112]
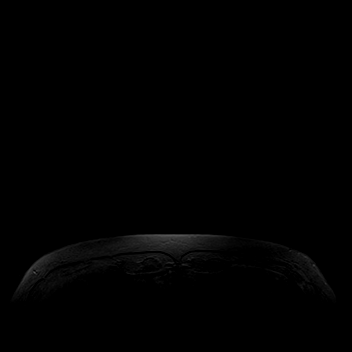
[im 28/112]
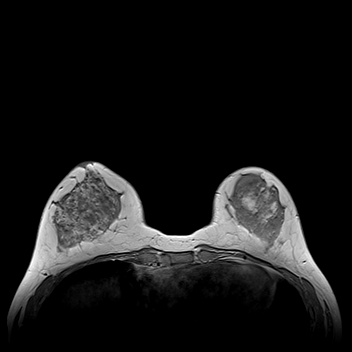
[im 56/112]
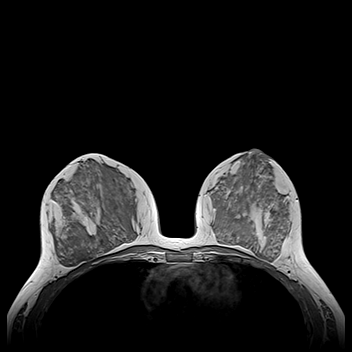
[im 84/112]
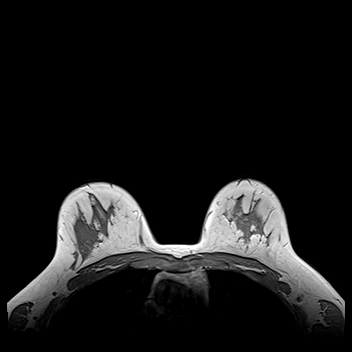
[im 112/112]
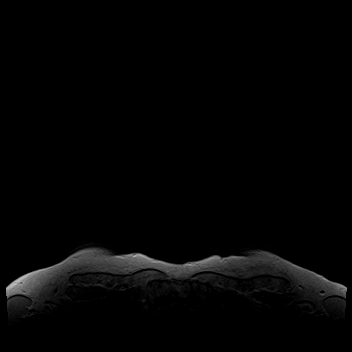

[Series 3: T2 · axial · B · 3.0mm · 1.02mm/px · 1 of 46 slices shown]
[im 1/46]
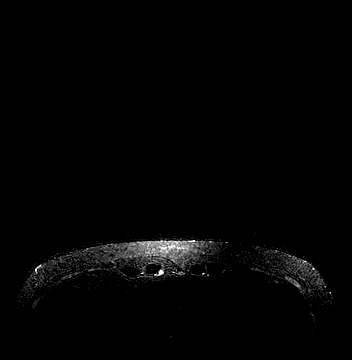

[6 of 48 positions shown; findings below may reference images not displayed]

Three-dimensional MR images were rendered by post-processing of the
original MR data on an independent workstation. The
three-dimensional MR images were interpreted, and findings are
reported in the following complete MRI report for this study. Three
dimensional images were evaluated at the independent Dyna[employer]
workstation
FINDINGS: Breast composition: d. Extreme fibroglandular tissue.

Background parenchymal enhancement: Moderate.

Right breast: No suspicious mass or abnormal enhancement. Small
scattered cysts are present.

Left breast: No suspicious mass or abnormal enhancement. Small
scattered cysts are present.

Lymph nodes: No abnormal appearing lymph nodes.

Ancillary findings:  None.
IMPRESSION: No MR evidence of breast malignancy.

Small scattered cysts within both breasts.

RECOMMENDATION:
Bilateral screening breast MRI in 1 year in this high risk patient.

Bilateral screening mammogram in [DATE] to resume annual
mammogram schedule.

BI-RADS CATEGORY  2: Benign.

## 2019-07-26 MED ORDER — GADOBUTROL 1 MMOL/ML IV SOLN
7.0000 mL | Freq: Once | INTRAVENOUS | Status: AC | PRN
  Administered 2019-07-26: 7 mL via INTRAVENOUS

## 2019-08-17 DIAGNOSIS — M2550 Pain in unspecified joint: Secondary | ICD-10-CM | POA: Diagnosis not present

## 2019-08-17 DIAGNOSIS — Z7189 Other specified counseling: Secondary | ICD-10-CM | POA: Diagnosis not present

## 2019-08-17 DIAGNOSIS — M0609 Rheumatoid arthritis without rheumatoid factor, multiple sites: Secondary | ICD-10-CM | POA: Diagnosis not present

## 2019-08-17 DIAGNOSIS — Z6827 Body mass index (BMI) 27.0-27.9, adult: Secondary | ICD-10-CM | POA: Diagnosis not present

## 2019-08-17 DIAGNOSIS — E663 Overweight: Secondary | ICD-10-CM | POA: Diagnosis not present

## 2019-08-17 DIAGNOSIS — Z79899 Other long term (current) drug therapy: Secondary | ICD-10-CM | POA: Diagnosis not present

## 2020-02-17 DIAGNOSIS — Z7189 Other specified counseling: Secondary | ICD-10-CM | POA: Diagnosis not present

## 2020-02-17 DIAGNOSIS — M2550 Pain in unspecified joint: Secondary | ICD-10-CM | POA: Diagnosis not present

## 2020-02-17 DIAGNOSIS — E663 Overweight: Secondary | ICD-10-CM | POA: Diagnosis not present

## 2020-02-17 DIAGNOSIS — M0609 Rheumatoid arthritis without rheumatoid factor, multiple sites: Secondary | ICD-10-CM | POA: Diagnosis not present

## 2020-02-17 DIAGNOSIS — Z6826 Body mass index (BMI) 26.0-26.9, adult: Secondary | ICD-10-CM | POA: Diagnosis not present

## 2020-02-17 DIAGNOSIS — Z79899 Other long term (current) drug therapy: Secondary | ICD-10-CM | POA: Diagnosis not present

## 2020-03-08 DIAGNOSIS — M955 Acquired deformity of pelvis: Secondary | ICD-10-CM | POA: Diagnosis not present

## 2020-03-08 DIAGNOSIS — M9905 Segmental and somatic dysfunction of pelvic region: Secondary | ICD-10-CM | POA: Diagnosis not present

## 2020-03-08 DIAGNOSIS — M62830 Muscle spasm of back: Secondary | ICD-10-CM | POA: Diagnosis not present

## 2020-03-08 DIAGNOSIS — M9903 Segmental and somatic dysfunction of lumbar region: Secondary | ICD-10-CM | POA: Diagnosis not present

## 2020-03-09 DIAGNOSIS — M9903 Segmental and somatic dysfunction of lumbar region: Secondary | ICD-10-CM | POA: Diagnosis not present

## 2020-03-09 DIAGNOSIS — M62830 Muscle spasm of back: Secondary | ICD-10-CM | POA: Diagnosis not present

## 2020-03-09 DIAGNOSIS — M955 Acquired deformity of pelvis: Secondary | ICD-10-CM | POA: Diagnosis not present

## 2020-03-09 DIAGNOSIS — M9905 Segmental and somatic dysfunction of pelvic region: Secondary | ICD-10-CM | POA: Diagnosis not present

## 2020-03-13 DIAGNOSIS — M9903 Segmental and somatic dysfunction of lumbar region: Secondary | ICD-10-CM | POA: Diagnosis not present

## 2020-03-13 DIAGNOSIS — M955 Acquired deformity of pelvis: Secondary | ICD-10-CM | POA: Diagnosis not present

## 2020-03-13 DIAGNOSIS — M9905 Segmental and somatic dysfunction of pelvic region: Secondary | ICD-10-CM | POA: Diagnosis not present

## 2020-03-13 DIAGNOSIS — M62830 Muscle spasm of back: Secondary | ICD-10-CM | POA: Diagnosis not present

## 2020-03-15 DIAGNOSIS — M955 Acquired deformity of pelvis: Secondary | ICD-10-CM | POA: Diagnosis not present

## 2020-03-15 DIAGNOSIS — M9905 Segmental and somatic dysfunction of pelvic region: Secondary | ICD-10-CM | POA: Diagnosis not present

## 2020-03-15 DIAGNOSIS — M9903 Segmental and somatic dysfunction of lumbar region: Secondary | ICD-10-CM | POA: Diagnosis not present

## 2020-03-15 DIAGNOSIS — M62830 Muscle spasm of back: Secondary | ICD-10-CM | POA: Diagnosis not present

## 2020-04-11 NOTE — Telephone Encounter (Signed)
Um hi Jonelle Sidle?

## 2020-05-11 ENCOUNTER — Encounter: Payer: 59 | Admitting: Primary Care

## 2020-05-17 ENCOUNTER — Encounter: Payer: 59 | Admitting: Primary Care

## 2020-06-01 ENCOUNTER — Other Ambulatory Visit: Payer: Self-pay

## 2020-06-01 ENCOUNTER — Encounter: Payer: Self-pay | Admitting: Primary Care

## 2020-06-01 ENCOUNTER — Encounter: Payer: Self-pay | Admitting: Obstetrics and Gynecology

## 2020-06-01 ENCOUNTER — Ambulatory Visit (INDEPENDENT_AMBULATORY_CARE_PROVIDER_SITE_OTHER): Payer: 59 | Admitting: Primary Care

## 2020-06-01 VITALS — BP 108/74 | HR 68 | Temp 98.7°F | Ht 69.0 in | Wt 171.0 lb

## 2020-06-01 DIAGNOSIS — Z803 Family history of malignant neoplasm of breast: Secondary | ICD-10-CM

## 2020-06-01 DIAGNOSIS — Z0000 Encounter for general adult medical examination without abnormal findings: Secondary | ICD-10-CM | POA: Diagnosis not present

## 2020-06-01 DIAGNOSIS — M069 Rheumatoid arthritis, unspecified: Secondary | ICD-10-CM

## 2020-06-01 DIAGNOSIS — K219 Gastro-esophageal reflux disease without esophagitis: Secondary | ICD-10-CM

## 2020-06-01 DIAGNOSIS — Z1231 Encounter for screening mammogram for malignant neoplasm of breast: Secondary | ICD-10-CM

## 2020-06-01 NOTE — Assessment & Plan Note (Signed)
No recent symptoms or use of famotidine.  Continue to monitor.

## 2020-06-01 NOTE — Assessment & Plan Note (Signed)
Chronic, stable and doing well on methotrexate 25 mg weekly, follows with rheumatology. Continue current regimen.

## 2020-06-01 NOTE — Assessment & Plan Note (Signed)
Tetanus due, will provide today. Mammogram due and pending. Pap smear UTD.  Commended her on dietary changes and regular exercise.   Exam today unremarkable. Labs pending.

## 2020-06-01 NOTE — Patient Instructions (Signed)
Complete your labs as discussed.  [facility] to schedule your [facility].   It was a pleasure to see you today!   Preventive Care 71-40 Years Old, Female Preventive care refers to lifestyle choices and visits with your health care provider that can promote health and wellness. This includes:  A yearly physical exam. This is also called an annual wellness visit.  Regular dental and eye exams.  Immunizations.  Screening for certain conditions.  Healthy lifestyle choices, such as: ? Eating a healthy diet. ? Getting regular exercise. ? Not using drugs or products that contain nicotine and tobacco. ? Limiting alcohol use. What can I expect for my preventive care visit? Physical exam Your health care provider may check your:  Height and weight. These may be used to calculate your BMI (body mass index). BMI is a measurement that tells if you are at a healthy weight.  Heart rate and blood pressure.  Body temperature.  Skin for abnormal spots. Counseling Your health care provider may ask you questions about your:  Past medical problems.  Family's medical history.  Alcohol, tobacco, and drug use.  Emotional well-being.  Home life and relationship well-being.  Sexual activity.  Diet, exercise, and sleep habits.  Work and work Statistician.  Access to firearms.  Method of birth control.  Menstrual cycle.  Pregnancy history. What immunizations do I need? Vaccines are usually given at various ages, according to a schedule. Your health care provider will recommend vaccines for you based on your age, medical history, and lifestyle or other factors, such as travel or where you work.   What tests do I need? Blood tests  Lipid and cholesterol levels. These may be checked every 5 years starting at age 85.  Hepatitis C test.  Hepatitis B test. Screening  Diabetes screening. This is done by checking your blood sugar (glucose) after you have not eaten for a  while (fasting).  STD (sexually transmitted disease) testing, if you are at risk.  BRCA-related cancer screening. This may be done if you have a family history of breast, ovarian, tubal, or peritoneal cancers.  Pelvic exam and Pap test. This may be done every 3 years starting at age 73. Starting at age 77, this may be done every 5 years if you have a Pap test in combination with an HPV test. Talk with your health care provider about your test results, treatment options, and if necessary, the need for more tests.   Follow these instructions at home: Eating and drinking  Eat a healthy diet that includes fresh fruits and vegetables, whole grains, lean protein, and low-fat dairy products.  Take vitamin and mineral supplements as recommended by your health care provider.  Do not drink alcohol if: ? Your health care provider tells you not to drink. ? You are pregnant, may be pregnant, or are planning to become pregnant.  If you drink alcohol: ? Limit how much you have to 0-1 drink a day. ? Be aware of how much alcohol is in your drink. In the [place] one drink equals one 12 oz bottle of beer (355 mL), one 5 oz glass of wine (148 mL), or one 1 oz glass of hard liquor (44 mL).   Lifestyle  Take daily care of your teeth and gums. Brush your teeth every morning and night with fluoride toothpaste. Floss one time each day.  Stay active. Exercise for at least 30 minutes 5 or more days each week.  Do not use any  products that contain nicotine or tobacco, such as cigarettes, e-cigarettes, and chewing tobacco. If you need help quitting, ask your health care provider.  Do not use drugs.  If you are sexually active, practice safe sex. Use a condom or other form of protection to prevent STIs (sexually transmitted infections).  If you do not wish to become pregnant, use a form of birth control. If you plan to become pregnant, see your health care provider for a prepregnancy visit.  Find healthy ways  to cope with stress, such as: ? Meditation, yoga, or listening to music. ? Journaling. ? Talking to a trusted person. ? Spending time with friends and family. Safety  Always wear your seat belt while driving or riding in a vehicle.  Do not drive: ? If you have been drinking alcohol. Do not ride with someone who has been drinking. ? When you are tired or distracted. ? While texting.  Wear a helmet and other protective equipment during sports activities.  If you have firearms in your house, make sure you follow all gun safety procedures.  Seek help if you have been physically or sexually abused. What's next?  Go to your health care provider once a year for an annual wellness visit.  Ask your health care provider how often you should have your eyes and teeth checked.  Stay up to date on all vaccines. This information is not intended to replace advice given to you by your health care provider. Make sure you discuss any questions you have with your health care provider. Document Revised: 08/29/2019 Document Reviewed: 09/11/2017 Elsevier Patient Education  2021 [employer].

## 2020-06-01 NOTE — Progress Notes (Signed)
Subjective:    Patient ID: Betty Burgess, female    DOB: [birth year], 40 y.o.   MRN: [id number]  HPI  Betty Burgess is a very pleasant 40 y.o. female who presents today for complete physical.  She did notice a recent bout of palpitations, checked her apple watch which showed "atrial fibrillation", lasted about 30 seconds. She denies chest pain, SOB, dizziness. Prior to this incident she had run that morning, was also drinking a diet mountain dew, was in the car during the incident.   Immunizations: -Tetanus: Unsure.  -Influenza: Completed this season  -Covid-19: Completed 3 vaccines  Diet: She endorses an improved diet.  Exercise: She is running several days weekly  Eye exam: No recent exam Dental exam:  Completed often   Pap Smear: 2021 Mammogram: 2021, MR breasts in 2021  BP Readings from Last 3 Encounters:  06/01/20 108/74  06/02/19 98/80  05/10/19 120/70   Wt Readings from Last 3 Encounters:  06/01/20 171 lb (77.6 kg)  06/02/19 173 lb (78.5 kg)  05/10/19 172 lb 1 oz (78 kg)      Review of Systems  Constitutional: Negative for unexpected weight change.  HENT: Negative for rhinorrhea.   Eyes: Negative for visual disturbance.  Respiratory: Negative for cough and shortness of breath.   Cardiovascular: Negative for chest pain.       See HPI  Gastrointestinal: Negative for constipation and diarrhea.  Genitourinary: Negative for difficulty urinating.  Musculoskeletal: Negative for arthralgias and myalgias.  Skin: Negative for rash.  Allergic/Immunologic: Negative for environmental allergies.  Neurological: Negative for dizziness, numbness and headaches.  Psychiatric/Behavioral: The patient is not nervous/anxious.          Past Medical History:  Diagnosis Date  . BRCA negative    24 gene panel neg  . Cervical dysplasia   . Family history of BRCA1 gene positive    MGM  . Family history of breast cancer   . GERD (gastroesophageal reflux disease)   .  Heart murmur   . RA (rheumatoid arthritis) ([facility])     Social History   Socioeconomic History  . Marital status: Married    Spouse name: Not on file  . Number of children: Not on file  . Years of education: Not on file  . Highest education level: Not on file  Occupational History  . Not on file  Tobacco Use  . Smoking status: Current Every Day Smoker    Packs/day: 1.00    Types: Cigarettes  . Smokeless tobacco: Never Used  Vaping Use  . Vaping Use: Never used  Substance and Sexual Activity  . Alcohol use: Yes  . Drug use: Never  . Sexual activity: Yes    Birth control/protection: I.U.D.    Comment: Mirena  Other Topics Concern  . Not on file  Social History Narrative   Married.   No children.   2 dogs.   Moved from [place].    Social Determinants of Health   Financial Resource Strain: Not on file  Food Insecurity: Not on file  Transportation Needs: Not on file  Physical Activity: Not on file  Stress: Not on file  Social Connections: Not on file  Intimate Partner Violence: Not on file    Past Surgical History:  Procedure Laterality Date  . LEEP  ~2015  . WRIST GANGLION EXCISION  2012    Family History  Problem Relation Age of Onset  . Early death Mother   . Breast cancer Mother  48       BRCA neg  . Alcohol abuse Father   . Birth defects Father   . Cancer Father   . Stroke Father   . Hyperlipidemia Maternal Grandmother   . Breast cancer Maternal Grandmother 77       BRCA 1 pos  . Diabetes Maternal Grandmother   . Hyperlipidemia Maternal Grandfather   . Diabetes Maternal Grandfather   . Arthritis Paternal Grandmother   . Diabetes Paternal Grandmother   . Stroke Paternal Grandfather   . Breast cancer Maternal Aunt 50       BRCA neg  . Hepatitis C Maternal Aunt     Allergies  Allergen Reactions  . Pertussis Vaccines     Current Outpatient Medications on File Prior to Visit  Medication Sig Dispense Refill  . cholecalciferol (VITAMIN D3) 25  MCG (1000 UNIT) tablet Take 1,000 Units by mouth daily.    . famotidine (PEPCID) 20 MG tablet Take 1 tablet (20 mg total) by mouth daily. As needed for heartburn. 90 tablet 0  . folic acid (FOLVITE) 1 MG tablet Take 1 mg by mouth.     . levonorgestrel (MIRENA) 20 MCG/24HR IUD 1 each by Intrauterine route once.     . methotrexate (RHEUMATREX) 2.5 MG tablet Take 2.5 mg by mouth once a week. Caution:Chemotherapy. Protect from light. 10 pills once a week.     No current facility-administered medications on file prior to visit.    BP 108/74   Pulse 68   Temp 98.7 F (37.1 C) (Temporal)   Ht 5' 9" (1.753 m)   Wt 171 lb (77.6 kg)   SpO2 99%   BMI 25.25 kg/m  Objective:   Physical Exam HENT:     Right Ear: Tympanic membrane and ear canal normal.     Left Ear: Tympanic membrane and ear canal normal.     Nose: Nose normal.  Eyes:     Conjunctiva/sclera: Conjunctivae normal.     Pupils: Pupils are equal, round, and reactive to light.  Neck:     Thyroid: No thyromegaly.  Cardiovascular:     Rate and Rhythm: Normal rate and regular rhythm.     Heart sounds: No murmur heard.   Pulmonary:     Effort: Pulmonary effort is normal.     Breath sounds: Normal breath sounds. No rales.  Abdominal:     General: Bowel sounds are normal.     Palpations: Abdomen is soft.     Tenderness: There is no abdominal tenderness.  Musculoskeletal:        General: Normal range of motion.     Cervical back: Neck supple.  Lymphadenopathy:     Cervical: No cervical adenopathy.  Skin:    General: Skin is warm and dry.     Findings: No rash.  Neurological:     Mental Status: She is alert and oriented to person, place, and time.     Cranial Nerves: No cranial nerve deficit.     Deep Tendon Reflexes: Reflexes are normal and symmetric.  Psychiatric:        Mood and Affect: Mood normal.           Assessment & Plan:      This visit occurred during the SARS-CoV-2 public health emergency.  Safety  protocols were in place, including screening questions prior to the visit, additional usage of staff PPE, and extensive cleaning of exam room while observing appropriate contact time as indicated for disinfecting solutions.

## 2020-06-01 NOTE — Assessment & Plan Note (Signed)
Mammogram due and pending. MR breasts UTD.

## 2020-06-02 ENCOUNTER — Other Ambulatory Visit (INDEPENDENT_AMBULATORY_CARE_PROVIDER_SITE_OTHER): Payer: 59

## 2020-06-02 ENCOUNTER — Other Ambulatory Visit: Payer: Self-pay

## 2020-06-02 DIAGNOSIS — Z0000 Encounter for general adult medical examination without abnormal findings: Secondary | ICD-10-CM

## 2020-06-02 DIAGNOSIS — M0609 Rheumatoid arthritis without rheumatoid factor, multiple sites: Secondary | ICD-10-CM | POA: Diagnosis not present

## 2020-06-02 LAB — LIPID PANEL
Cholesterol: 186 mg/dL (ref 0–200)
HDL: 55.4 mg/dL (ref >39.00)
LDL Cholesterol: 120 mg/dL — ABNORMAL HIGH (ref 0–99)
NonHDL: 130.28
Total CHOL/HDL Ratio: 3
Triglycerides: 52 mg/dL (ref 0.0–149.0)
VLDL: 10.4 mg/dL (ref 0.0–40.0)

## 2020-06-03 LAB — CBC WITH DIFFERENTIAL/PLATELET
Basophils Absolute: 0 10*3/uL (ref 0.0–0.2)
Basos: 1 %
EOS (ABSOLUTE): 0.1 10*3/uL (ref 0.0–0.4)
Eos: 1 %
Hematocrit: 44.3 % (ref 34.0–46.6)
Hemoglobin: 15.4 g/dL (ref 11.1–15.9)
Immature Grans (Abs): 0 10*3/uL (ref 0.0–0.1)
Immature Granulocytes: 0 %
Lymphocytes Absolute: 2.1 10*3/uL (ref 0.7–3.1)
Lymphs: 26 %
MCH: 34.2 pg — ABNORMAL HIGH (ref 26.6–33.0)
MCHC: 34.8 g/dL (ref 31.5–35.7)
MCV: 98 fL — ABNORMAL HIGH (ref 79–97)
Monocytes Absolute: 0.7 10*3/uL (ref 0.1–0.9)
Monocytes: 8 %
Neutrophils Absolute: 5.3 10*3/uL (ref 1.4–7.0)
Neutrophils: 64 %
Platelets: 215 10*3/uL (ref 150–450)
RBC: 4.5 x10E6/uL (ref 3.77–5.28)
RDW: 12.2 % (ref 11.7–15.4)
WBC: 8.2 10*3/uL (ref 3.4–10.8)

## 2020-06-03 LAB — COMPREHENSIVE METABOLIC PANEL
ALT: 11 IU/L (ref 0–32)
AST: 15 IU/L (ref 0–40)
Albumin/Globulin Ratio: 2.5 — ABNORMAL HIGH (ref 1.2–2.2)
Albumin: 4.8 g/dL (ref 3.8–4.8)
Alkaline Phosphatase: 40 IU/L — ABNORMAL LOW (ref 44–121)
BUN/Creatinine Ratio: 13 (ref 9–23)
BUN: 9 mg/dL (ref 6–20)
Bilirubin Total: 0.6 mg/dL (ref 0.0–1.2)
CO2: 21 mmol/L (ref 20–29)
Calcium: 8.8 mg/dL (ref 8.7–10.2)
Chloride: 101 mmol/L (ref 96–106)
Creatinine, Ser: 0.67 mg/dL (ref 0.57–1.00)
Globulin, Total: 1.9 g/dL (ref 1.5–4.5)
Glucose: 86 mg/dL (ref 65–99)
Potassium: 4.2 mmol/L (ref 3.5–5.2)
Sodium: 141 mmol/L (ref 134–144)
Total Protein: 6.7 g/dL (ref 6.0–8.5)
eGFR: 114 mL/min/{1.73_m2} (ref >59)

## 2020-06-05 ENCOUNTER — Ambulatory Visit: Payer: 59 | Admitting: Obstetrics and Gynecology

## 2020-06-13 ENCOUNTER — Other Ambulatory Visit: Payer: Self-pay

## 2020-06-13 ENCOUNTER — Ambulatory Visit (INDEPENDENT_AMBULATORY_CARE_PROVIDER_SITE_OTHER): Payer: 59 | Admitting: Obstetrics and Gynecology

## 2020-06-13 ENCOUNTER — Encounter: Payer: Self-pay | Admitting: Obstetrics and Gynecology

## 2020-06-13 ENCOUNTER — Other Ambulatory Visit (HOSPITAL_COMMUNITY)
Admission: RE | Admit: 2020-06-13 | Discharge: 2020-06-13 | Disposition: A | Discharge status: Home / Self Care | Payer: 59 | Source: Ambulatory Visit | Attending: Obstetrics and Gynecology | Admitting: Obstetrics and Gynecology

## 2020-06-13 VITALS — BP 134/60 | Ht 68.5 in | Wt 171.0 lb

## 2020-06-13 DIAGNOSIS — Z01419 Encounter for gynecological examination (general) (routine) without abnormal findings: Secondary | ICD-10-CM | POA: Diagnosis not present

## 2020-06-13 DIAGNOSIS — Z9189 Other specified personal risk factors, not elsewhere classified: Secondary | ICD-10-CM

## 2020-06-13 DIAGNOSIS — Z30431 Encounter for routine checking of intrauterine contraceptive device: Secondary | ICD-10-CM

## 2020-06-13 DIAGNOSIS — N879 Dysplasia of cervix uteri, unspecified: Secondary | ICD-10-CM | POA: Insufficient documentation

## 2020-06-13 DIAGNOSIS — Z803 Family history of malignant neoplasm of breast: Secondary | ICD-10-CM

## 2020-06-13 DIAGNOSIS — Z1231 Encounter for screening mammogram for malignant neoplasm of breast: Secondary | ICD-10-CM | POA: Diagnosis not present

## 2020-06-13 DIAGNOSIS — Z124 Encounter for screening for malignant neoplasm of cervix: Secondary | ICD-10-CM

## 2020-06-13 NOTE — Patient Instructions (Signed)
I value your feedback and you entrusting us with your care. If you get a [facility] patient survey, I would appreciate you taking the time to let us know about your experience today. Thank you! ? ? ?

## 2020-06-13 NOTE — Progress Notes (Signed)
PCP:  Pleas Koch, NP   Chief Complaint  Patient presents with  . Gynecologic Exam    No concerns     HPI:      [name] is a 40 y.o. G0P0000 whose LMP was No LMP recorded. (Menstrual status: IUD)., presents today for her annual examination.  Her menses are infrequent spotting with IUD. Had a little heavier flow LMP for 3 days. No BTB, no dysmen.   Sex activity: single partner, contraception - IUD. Mirena placed 04/03/15; has occas spotting after sex.  Last Pap: 06/02/19  Results were: no abnormalities /neg HPV DNA . S/P LEEP less than 10 yrs ago. Had neg margins. Does regular paps and normal paps since.  Hx of STDs: HPV  Last mammogram: 01/18/19  Results were: normal--routine follow-up in 12 months. Does yearly scr breast MRI with neg results 7/21. Has upcoming mammo sched 6/22 with PCP There is a FH of breast cancer in her mom, MGM, and mat aunt. Genetic testing has been done. Her MGM was BRCA 1 positive. Pt is 24 gene panel neg. Still at increased risk based on FH per GC.  There is no FH of ovarian cancer. The patient does not do self-breast exams.  Tobacco use: smokes 1/2 ppd, down from 1 ppd Alcohol use: none No drug use.  Exercise: very active  She does get adequate calcium and Vitamin D in her diet. Labs with PCP   Past Medical History:  Diagnosis Date  . BRCA negative    24 gene panel neg  . Cervical dysplasia   . Family history of BRCA1 gene positive    MGM  . Family history of breast cancer   . GERD (gastroesophageal reflux disease)   . Heart murmur   . RA (rheumatoid arthritis) ([place])     Past Surgical History:  Procedure Laterality Date  . LEEP  ~2015  . WRIST GANGLION EXCISION  2012    Family History  Problem Relation Age of Onset  . Early death Mother   . Breast cancer Mother 28       BRCA neg  . Alcohol abuse Father   . Birth defects Father   . Stroke Father   . Lung cancer Father   . Hyperlipidemia Maternal Grandmother   .  Breast cancer Maternal Grandmother 94       BRCA 1 pos  . Diabetes Maternal Grandmother   . Hyperlipidemia Maternal Grandfather   . Diabetes Maternal Grandfather   . Arthritis Paternal Grandmother   . Diabetes Paternal Grandmother   . Stroke Paternal Grandfather   . Breast cancer Maternal Aunt 58       BRCA neg  . Hepatitis C Maternal Aunt     Social History   Socioeconomic History  . Marital status: Married    Spouse name: Not on file  . Number of children: Not on file  . Years of education: Not on file  . Highest education level: Not on file  Occupational History  . Not on file  Tobacco Use  . Smoking status: Current Every Day Smoker    Packs/day: 1.00    Types: Cigarettes  . Smokeless tobacco: Never Used  Vaping Use  . Vaping Use: Never used  Substance and Sexual Activity  . Alcohol use: Yes  . Drug use: Never  . Sexual activity: Yes    Birth control/protection: I.U.D.    Comment: Mirena  Other Topics Concern  . Not on file  Social History Narrative   Married.   No children.   2 dogs.   Moved from [place].    Social Determinants of Health   Financial Resource Strain: Not on file  Food Insecurity: Not on file  Transportation Needs: Not on file  Physical Activity: Not on file  Stress: Not on file  Social Connections: Not on file  Intimate Partner Violence: Not on file     Current Outpatient Medications:  .  cholecalciferol (VITAMIN D3) 25 MCG (1000 UNIT) tablet, Take 1,000 Units by mouth daily., Disp: , Rfl:  .  famotidine (PEPCID) 20 MG tablet, Take 1 tablet (20 mg total) by mouth daily. As needed for heartburn., Disp: 90 tablet, Rfl: 0 .  folic acid (FOLVITE) 1 MG tablet, Take 1 mg by mouth. , Disp: , Rfl:  .  levonorgestrel (MIRENA) 20 MCG/24HR IUD, 1 each by Intrauterine route once. , Disp: , Rfl:  .  methotrexate (RHEUMATREX) 2.5 MG tablet, Take 2.5 mg by mouth once a week. Caution:Chemotherapy. Protect from light. 10 pills once a week., Disp: ,  Rfl:    ROS:  Review of Systems  Constitutional: Negative for fatigue, fever and unexpected weight change.  Respiratory: Negative for cough, shortness of breath and wheezing.   Cardiovascular: Negative for chest pain, palpitations and leg swelling.  Gastrointestinal: Negative for blood in stool, constipation, diarrhea, nausea and vomiting.  Endocrine: Negative for cold intolerance, heat intolerance and polyuria.  Genitourinary: Negative for dyspareunia, dysuria, flank pain, frequency, genital sores, hematuria, menstrual problem, pelvic pain, urgency, vaginal bleeding, vaginal discharge and vaginal pain.  Musculoskeletal: Negative for back pain, joint swelling and myalgias.  Skin: Negative for rash.  Neurological: Negative for dizziness, syncope, light-headedness, numbness and headaches.  Hematological: Negative for adenopathy.  Psychiatric/Behavioral: Negative for agitation, confusion, sleep disturbance and suicidal ideas. The patient is not nervous/anxious.   BREAST: No symptoms   Objective: BP 134/60   Ht 5' 8.5" (1.74 m)   Wt 171 lb (77.6 kg)   BMI 25.62 kg/m    Physical Exam Constitutional:      Appearance: She is well-developed.  Genitourinary:     Vulva normal.     Right Labia: No rash, tenderness or lesions.    Left Labia: No tenderness, lesions or rash.    No vaginal discharge, erythema or tenderness.      Right Adnexa: not tender and no mass present.    Left Adnexa: not tender and no mass present.    No cervical friability or polyp.     IUD strings visualized.     Uterus is not enlarged or tender.  Breasts:     Right: No mass, nipple discharge, skin change or tenderness.     Left: No mass, nipple discharge, skin change or tenderness.    Neck:     Thyroid: No thyromegaly.  Cardiovascular:     Rate and Rhythm: Normal rate and regular rhythm.     Heart sounds: Normal heart sounds. No murmur heard.   Pulmonary:     Effort: Pulmonary effort is normal.      Breath sounds: Normal breath sounds.  Abdominal:     Palpations: Abdomen is soft.     Tenderness: There is no abdominal tenderness. There is no guarding or rebound.  Musculoskeletal:        General: Normal range of motion.     Cervical back: Normal range of motion.  Lymphadenopathy:     Cervical: No cervical adenopathy.  Neurological:  General: No focal deficit present.     Mental Status: She is alert and oriented to person, place, and time.     Cranial Nerves: No cranial nerve deficit.  Skin:    General: Skin is warm and dry.  Psychiatric:        Mood and Affect: Mood normal.        Behavior: Behavior normal.        Thought Content: Thought content normal.        Judgment: Judgment normal.  Vitals reviewed.     Assessment/Plan: Encounter for annual routine gynecological examination  Cervical cancer screening - Plan: Cytology - PAP  Cervical dysplasia - Plan: Cytology - PAP; repeat pap today.  Encounter for routine checking of intrauterine contraceptive device (IUD)--IUD strings in cx os; placed 3/17, due for removal 3/24. May want replacement sooner if heavier flow continues. Wants to watch and wait for now since only happened 1 cycle. F/u prn.   Encounter for screening mammogram for malignant neoplasm of breast--has mammo 6/22  Family history of breast cancer--MGM is BRCA 1 pos; pt is BRCA neg. Does yearly mammos and scr breast MRI. Add Vit D supp.   Increased risk of breast cancer--per GC, doing yearly CBE, mammos and scr breast MRI. Cont Vit D supp.     GYN counsel breast self exam, mammography screening, adequate intake of calcium and vitamin D, diet and exercise, tobacco cessation     F/U  Return in about 1 year (around 06/13/2021).  Elizabth Palka B. Jerol Rufener, PA-C 06/13/2020 3:23 PM

## 2020-06-16 LAB — CYTOLOGY - PAP: Diagnosis: NEGATIVE

## 2020-06-20 ENCOUNTER — Ambulatory Visit (INDEPENDENT_AMBULATORY_CARE_PROVIDER_SITE_OTHER): Payer: 59

## 2020-06-20 DIAGNOSIS — Z23 Encounter for immunization: Secondary | ICD-10-CM | POA: Diagnosis not present

## 2020-06-20 NOTE — Progress Notes (Signed)
Per orders of Allie Bossier, AGNP-C, injection of TD given by Francella Solian in right deltoid. Patient tolerated injection well. Immunizations updated in [place] as well as in chart. VIS given.

## 2020-06-27 ENCOUNTER — Ambulatory Visit
Admission: RE | Admit: 2020-06-27 | Discharge: 2020-06-27 | Disposition: A | Discharge status: Home / Self Care | Payer: 59 | Source: Ambulatory Visit | Attending: Primary Care | Admitting: Primary Care

## 2020-06-27 ENCOUNTER — Other Ambulatory Visit: Payer: Self-pay

## 2020-06-27 DIAGNOSIS — Z1231 Encounter for screening mammogram for malignant neoplasm of breast: Secondary | ICD-10-CM | POA: Diagnosis not present

## 2020-06-27 IMAGING — MG MM DIGITAL SCREENING BILAT W/ TOMO AND CAD
6 of 12 series · 6 of 36 positions shown · non-contrast
Comparison: Previous exam(s).

CLINICAL DATA: Screening.

EXAM:
DIGITAL SCREENING BILATERAL MAMMOGRAM WITH TOMOSYNTHESIS AND CAD
TECHNIQUE: Bilateral screening digital craniocaudal and mediolateral oblique
mammograms were obtained. Bilateral screening digital breast
tomosynthesis was performed. The images were evaluated with
computer-aided detection.

[L MLO synth-2D (1 of 2)]
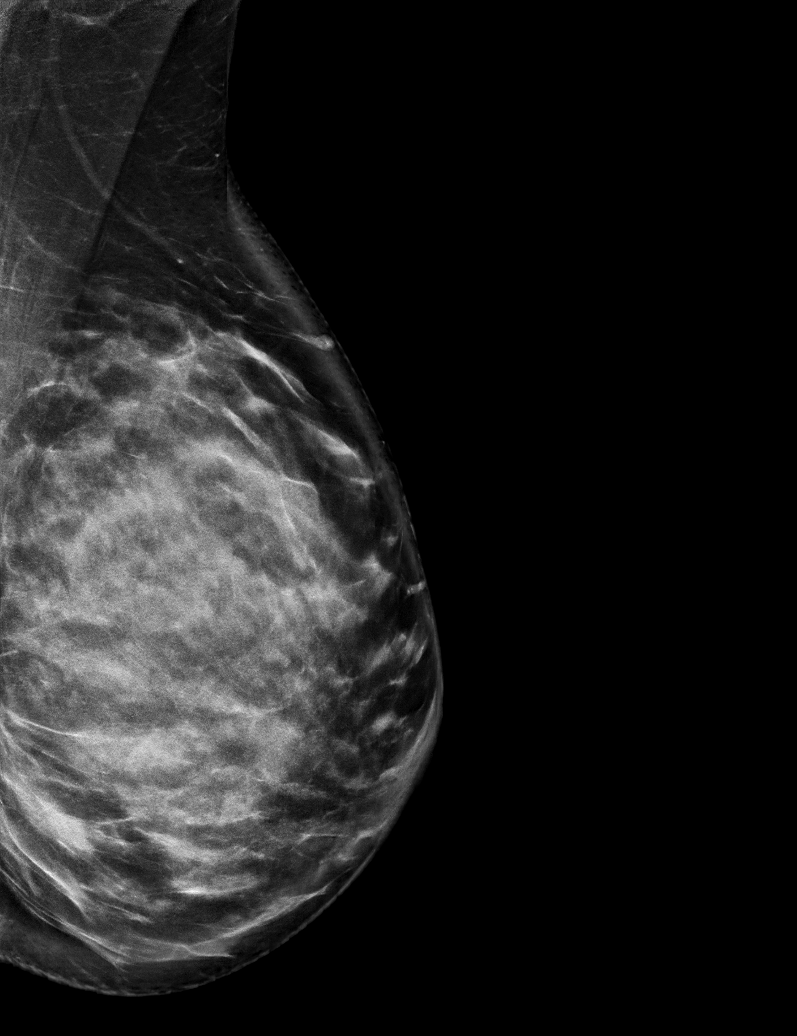

[L CC synth-2D]
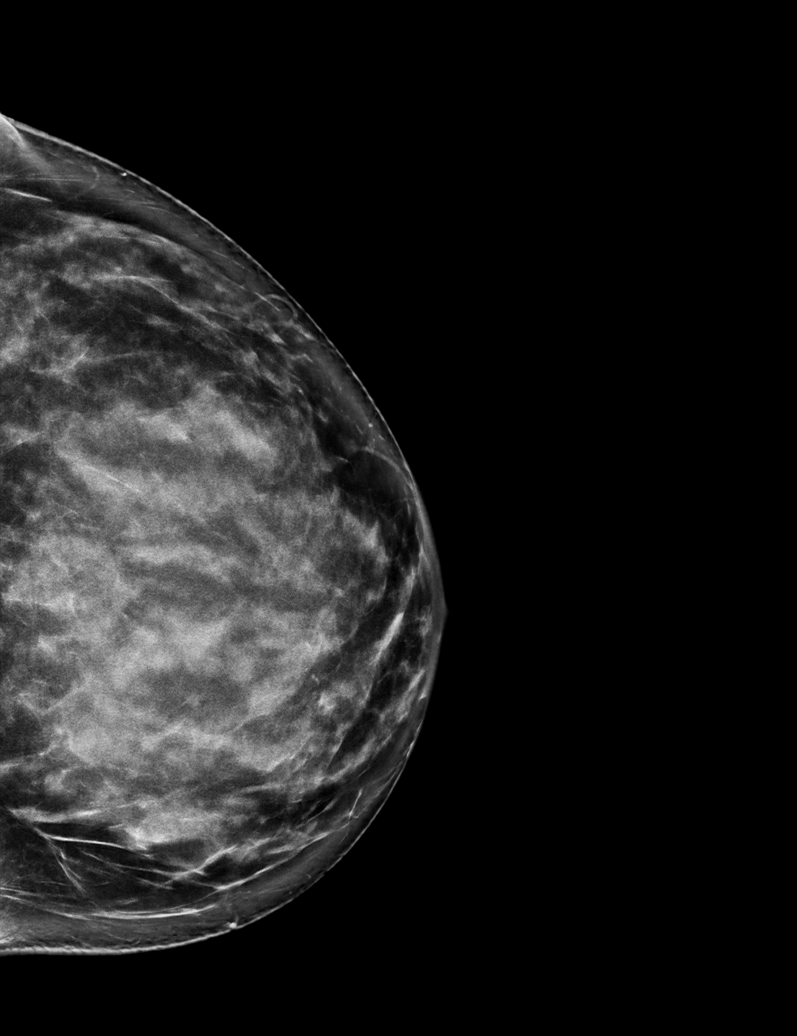

[R MLO synth-2D (1 of 2)]
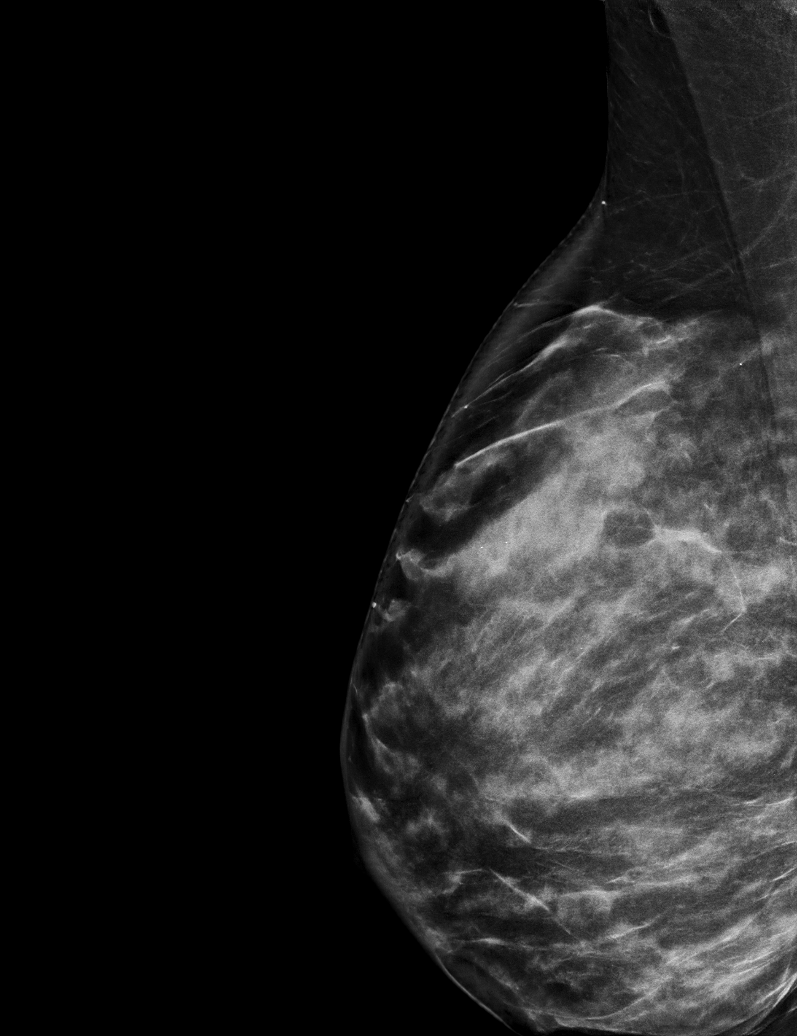

[L MLO synth-2D (2 of 2)]
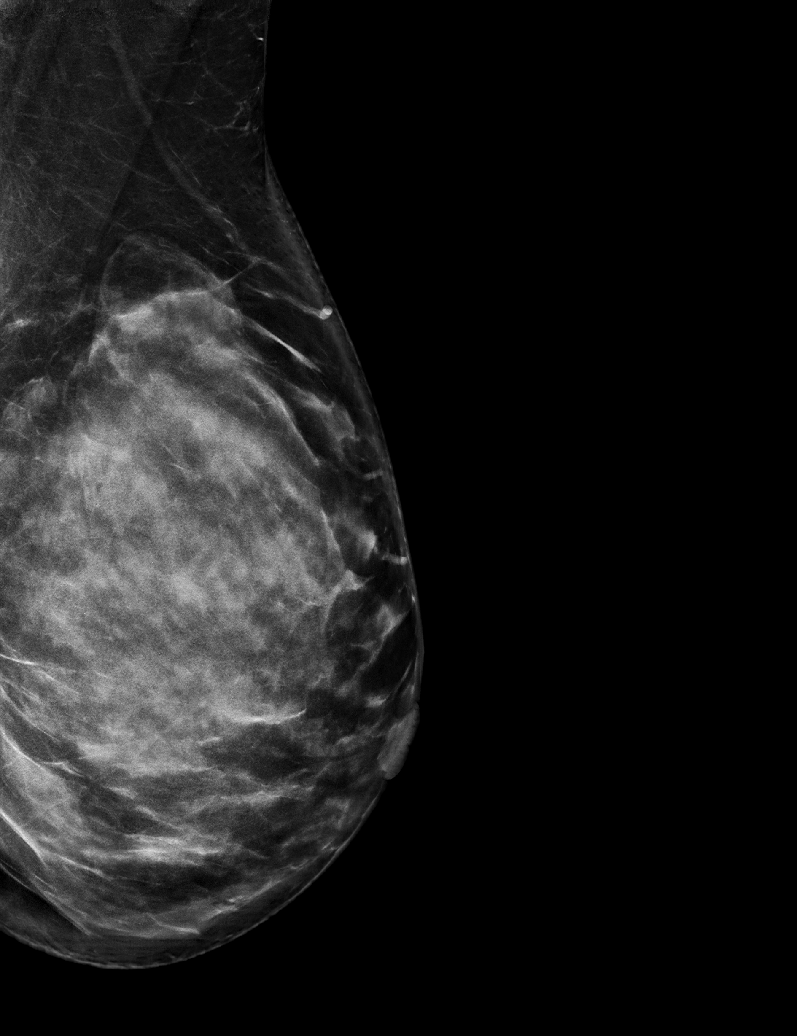

[R MLO synth-2D (2 of 2)]
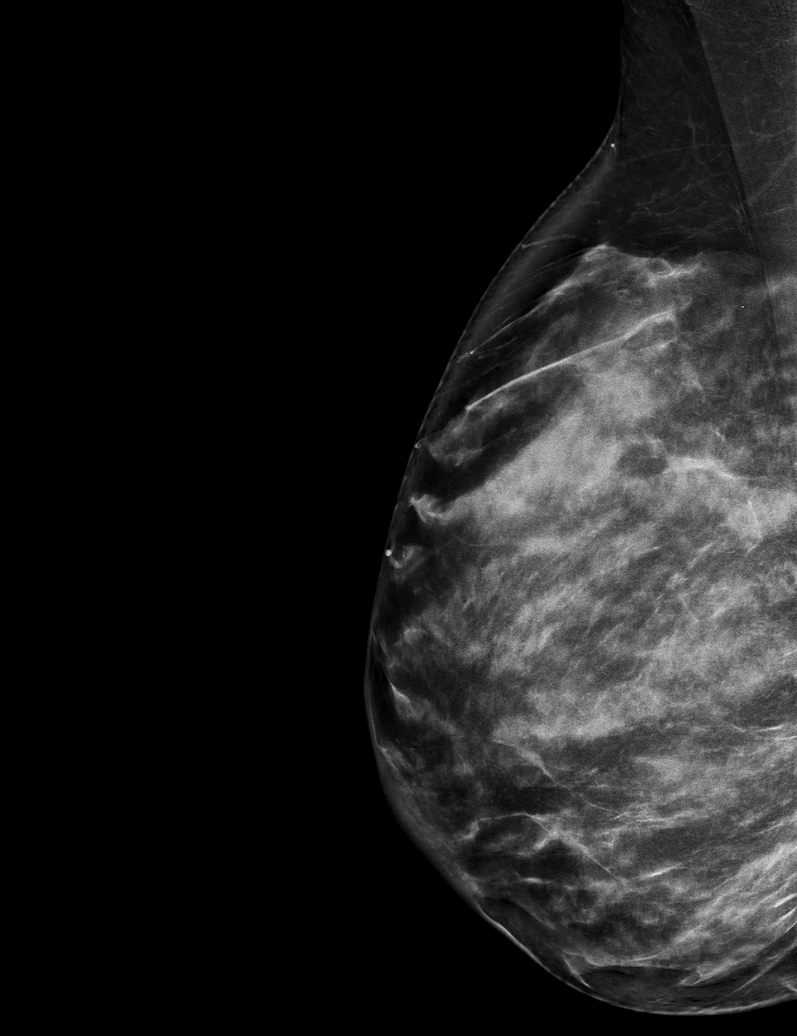

[R CC synth-2D]
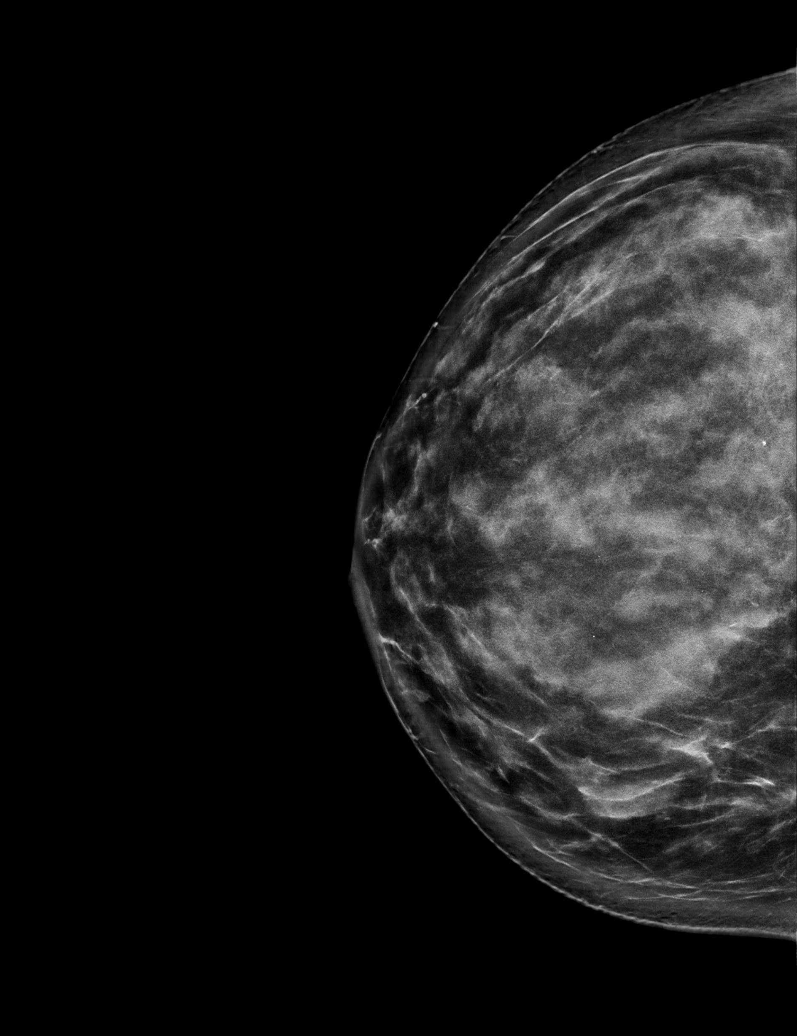

[6 of 36 positions shown; findings below may reference images not displayed]

ACR Breast Density Category d: The breast tissue is extremely dense,
which lowers the sensitivity of mammography
FINDINGS: There are no findings suspicious for malignancy.
IMPRESSION: No mammographic evidence of malignancy. A result letter of this
screening mammogram will be mailed directly to the patient.

RECOMMENDATION:
Screening mammogram in one year. (Code:[7P])

BI-RADS CATEGORY  1: Negative.

## 2020-08-08 DIAGNOSIS — Z79899 Other long term (current) drug therapy: Secondary | ICD-10-CM | POA: Diagnosis not present

## 2020-08-08 DIAGNOSIS — Z6825 Body mass index (BMI) 25.0-25.9, adult: Secondary | ICD-10-CM | POA: Diagnosis not present

## 2020-08-08 DIAGNOSIS — M2550 Pain in unspecified joint: Secondary | ICD-10-CM | POA: Diagnosis not present

## 2020-08-08 DIAGNOSIS — Z7189 Other specified counseling: Secondary | ICD-10-CM | POA: Diagnosis not present

## 2020-08-08 DIAGNOSIS — M0609 Rheumatoid arthritis without rheumatoid factor, multiple sites: Secondary | ICD-10-CM | POA: Diagnosis not present

## 2020-08-08 DIAGNOSIS — E663 Overweight: Secondary | ICD-10-CM | POA: Diagnosis not present

## 2020-09-07 ENCOUNTER — Other Ambulatory Visit: Payer: Self-pay

## 2020-09-07 ENCOUNTER — Other Ambulatory Visit (INDEPENDENT_AMBULATORY_CARE_PROVIDER_SITE_OTHER): Payer: 59

## 2020-09-07 DIAGNOSIS — Z0000 Encounter for general adult medical examination without abnormal findings: Secondary | ICD-10-CM | POA: Diagnosis not present

## 2020-09-07 DIAGNOSIS — M0609 Rheumatoid arthritis without rheumatoid factor, multiple sites: Secondary | ICD-10-CM | POA: Diagnosis not present

## 2020-09-08 LAB — COMPREHENSIVE METABOLIC PANEL
ALT: 10 IU/L (ref 0–32)
AST: 13 IU/L (ref 0–40)
Albumin/Globulin Ratio: 2.5 — ABNORMAL HIGH (ref 1.2–2.2)
Albumin: 4.7 g/dL (ref 3.8–4.8)
Alkaline Phosphatase: 42 IU/L — ABNORMAL LOW (ref 44–121)
BUN/Creatinine Ratio: 15 (ref 9–23)
BUN: 11 mg/dL (ref 6–20)
Bilirubin Total: 0.4 mg/dL (ref 0.0–1.2)
CO2: 22 mmol/L (ref 20–29)
Calcium: 9 mg/dL (ref 8.7–10.2)
Chloride: 100 mmol/L (ref 96–106)
Creatinine, Ser: 0.72 mg/dL (ref 0.57–1.00)
Globulin, Total: 1.9 g/dL (ref 1.5–4.5)
Glucose: 138 mg/dL — ABNORMAL HIGH (ref 65–99)
Potassium: 4.2 mmol/L (ref 3.5–5.2)
Sodium: 139 mmol/L (ref 134–144)
Total Protein: 6.6 g/dL (ref 6.0–8.5)
eGFR: 109 mL/min/{1.73_m2} (ref >59)

## 2020-09-08 LAB — CBC WITH DIFFERENTIAL/PLATELET
Basophils Absolute: 0 10*3/uL (ref 0.0–0.2)
Basos: 1 %
EOS (ABSOLUTE): 0.1 10*3/uL (ref 0.0–0.4)
Eos: 2 %
Hematocrit: 45.2 % (ref 34.0–46.6)
Hemoglobin: 15.5 g/dL (ref 11.1–15.9)
Immature Grans (Abs): 0 10*3/uL (ref 0.0–0.1)
Immature Granulocytes: 0 %
Lymphocytes Absolute: 1.7 10*3/uL (ref 0.7–3.1)
Lymphs: 25 %
MCH: 34 pg — ABNORMAL HIGH (ref 26.6–33.0)
MCHC: 34.3 g/dL (ref 31.5–35.7)
MCV: 99 fL — ABNORMAL HIGH (ref 79–97)
Monocytes Absolute: 0.6 10*3/uL (ref 0.1–0.9)
Monocytes: 9 %
Neutrophils Absolute: 4.4 10*3/uL (ref 1.4–7.0)
Neutrophils: 63 %
Platelets: 205 10*3/uL (ref 150–450)
RBC: 4.56 x10E6/uL (ref 3.77–5.28)
RDW: 12.1 % (ref 11.7–15.4)
WBC: 6.8 10*3/uL (ref 3.4–10.8)

## 2020-11-14 DIAGNOSIS — Z803 Family history of malignant neoplasm of breast: Secondary | ICD-10-CM

## 2020-11-14 DIAGNOSIS — Z1231 Encounter for screening mammogram for malignant neoplasm of breast: Secondary | ICD-10-CM

## 2020-12-22 ENCOUNTER — Ambulatory Visit
Admission: RE | Admit: 2020-12-22 | Discharge: 2020-12-22 | Disposition: A | Discharge status: Home / Self Care | Payer: 59 | Source: Ambulatory Visit | Attending: Primary Care | Admitting: Primary Care

## 2020-12-22 ENCOUNTER — Other Ambulatory Visit: Payer: Self-pay

## 2020-12-22 DIAGNOSIS — N6001 Solitary cyst of right breast: Secondary | ICD-10-CM | POA: Diagnosis not present

## 2020-12-22 DIAGNOSIS — N6489 Other specified disorders of breast: Secondary | ICD-10-CM | POA: Diagnosis not present

## 2020-12-22 DIAGNOSIS — Z1231 Encounter for screening mammogram for malignant neoplasm of breast: Secondary | ICD-10-CM | POA: Insufficient documentation

## 2020-12-22 DIAGNOSIS — N6002 Solitary cyst of left breast: Secondary | ICD-10-CM | POA: Diagnosis not present

## 2020-12-22 DIAGNOSIS — Z803 Family history of malignant neoplasm of breast: Secondary | ICD-10-CM | POA: Diagnosis not present

## 2020-12-22 IMAGING — MR MR BREAST BILAT WO/W CM
2 of 9 series · 6 of 48 positions shown · IV contrast (7ml Gadavist)
Comparison: Previous exam(s).

CLINICAL DATA: High risk screening breast MRI. Family history
breast cancer in mother, maternal aunt and maternal grandmother.

EXAM:
BILATERAL BREAST MRI WITH AND WITHOUT CONTRAST
TECHNIQUE: Multiplanar, multisequence MR images of both breasts were obtained
prior to and following the intravenous administration of 7.5 ml of
Gadavist.

[Series 2: T1 · axial · B · 1.5mm · 1.02mm/px · z∈[-55,+112]mm · 5 of 112 slices shown]
[im 1/112]
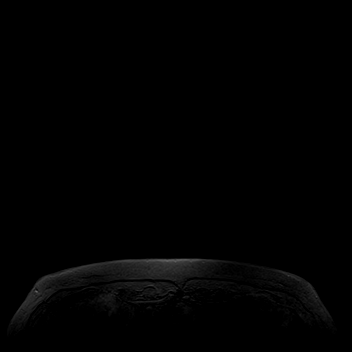
[im 28/112]
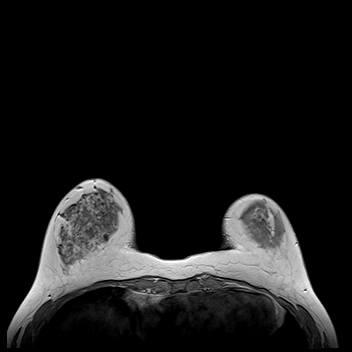
[im 56/112]
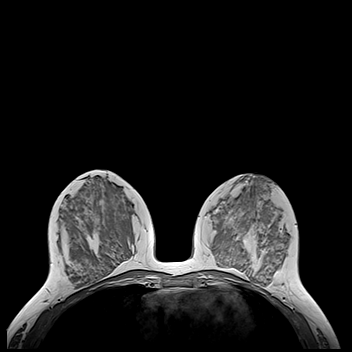
[im 84/112]
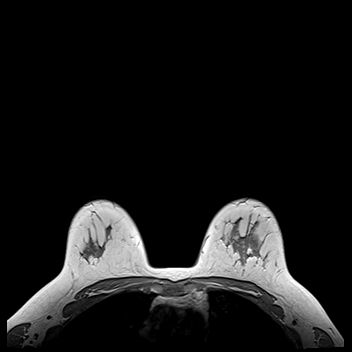
[im 112/112]
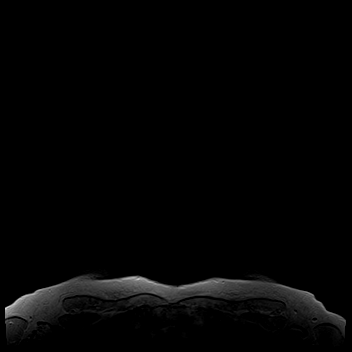

[Series 3: T2 · axial · B · 3.0mm · 1.02mm/px · 1 of 46 slices shown]
[im 1/46]
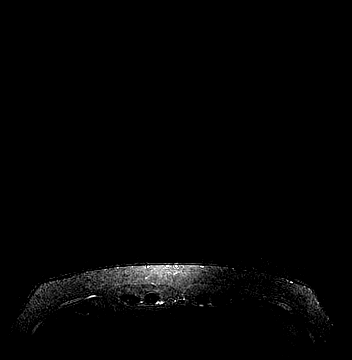

[6 of 48 positions shown; findings below may reference images not displayed]

Three-dimensional MR images were rendered by post-processing of the
original MR data on an independent workstation. The
three-dimensional MR images were interpreted, and findings are
reported in the following complete MRI report for this study. Three
dimensional images were evaluated at the independent interpreting
workstation using the DynaCAD thin client.
FINDINGS: Breast composition: d. Extreme fibroglandular tissue.

Background parenchymal enhancement: Moderate

Right breast: No mass or abnormal enhancement. Few scattered small
cysts are present.

Left breast: No mass or abnormal enhancement. Few small scattered
cysts are present.

Lymph nodes: No abnormal appearing lymph nodes.

Ancillary findings:  None.
IMPRESSION: 1.  Unremarkable breast MRI without evidence of malignancy.

2.  Small bilateral scattered cysts.

RECOMMENDATION:
Recommend continued annual high risk screening breast MRI in
addition to annual 3D screening mammography.

The American Cancer Society recommends annual MRI and mammography in
patients with an estimated lifetime risk of developing breast cancer
greater than 20 - 25%, or who are known or suspected to be positive
for the breast cancer gene.

BI-RADS CATEGORY  2: Benign.

## 2020-12-22 MED ORDER — GADOBUTROL 1 MMOL/ML IV SOLN
7.5000 mL | Freq: Once | INTRAVENOUS | Status: AC | PRN
  Administered 2020-12-22: 7.5 mL via INTRAVENOUS

## 2021-01-30 ENCOUNTER — Encounter: Payer: Self-pay | Admitting: Primary Care

## 2021-01-30 ENCOUNTER — Telehealth (INDEPENDENT_AMBULATORY_CARE_PROVIDER_SITE_OTHER): Payer: 59 | Admitting: Primary Care

## 2021-01-30 DIAGNOSIS — U071 COVID-19: Secondary | ICD-10-CM

## 2021-01-30 HISTORY — DX: COVID-19: U07.1

## 2021-01-30 NOTE — Progress Notes (Signed)
Patient ID: Betty Burgess, female    DOB: [birth year], 42 y.o.   MRN: [id number]  Virtual visit completed through [employer], a video enabled telemedicine application. Due to national recommendations of social distancing due to COVID-19, a virtual visit is felt to be most appropriate for this patient at this time. Reviewed limitations, risks, security and privacy concerns of performing a virtual visit and the availability of in person appointments. I also reviewed that there may be a patient responsible charge related to this service. The patient agreed to proceed.   Patient location: home Provider location: [place] [facility], office Persons participating in this virtual visit: patient, provider   If any vitals were documented, they were collected by patient at home unless specified below.    Temp (!) 100.4 F (38 C) (Oral)    Ht '5\' 8"'  (1.727 m)    Wt 171 lb (77.6 kg)    BMI 26.00 kg/m    CC: Positive Covid-19 Subjective:   HPI: Betty Burgess is a 41 y.o. female with a history of rheumatoid arthritis on immunosuppressant treatment (methotrexate), GERD presenting on 01/30/2021 for Covid-19 infection.   Symptom onset yesterday (01/29/21) while at work with body aches, fatigue, chills. She took a home Covid-19 test yesterday when she got home from work which was positive, also verified with a second test which was positive.   Now with symptoms of body aches, rhinorrhea, fatigue, fevers (100.5 t-max). She's been taking Tylenol and Dayquil, is feeling better today than she did last night. She denies cough, shortness of breath, chest pain.   She is at home, her employer is aware, she understands how to quarantine.       Relevant past medical, surgical, family and social history reviewed and updated as indicated. Interim medical history since our last visit reviewed. Allergies and medications reviewed and updated. Outpatient Medications Prior to Visit  Medication Sig Dispense  Refill   cholecalciferol (VITAMIN D3) 25 MCG (1000 UNIT) tablet Take 1,000 Units by mouth daily.     famotidine (PEPCID) 20 MG tablet Take 1 tablet (20 mg total) by mouth daily. As needed for heartburn. 90 tablet 0   folic acid (FOLVITE) 1 MG tablet Take 1 mg by mouth.      levonorgestrel (MIRENA) 20 MCG/24HR IUD 1 each by Intrauterine route once.      methotrexate (RHEUMATREX) 2.5 MG tablet Take 2.5 mg by mouth once a week. Caution:Chemotherapy. Protect from light. 10 pills once a week.     No facility-administered medications prior to visit.     Per HPI unless specifically indicated in ROS section below Review of Systems  Constitutional:  Positive for chills, fatigue and fever.  HENT:  Positive for rhinorrhea. Negative for congestion.   Respiratory:  Negative for cough and shortness of breath.   Cardiovascular:  Negative for chest pain.  Neurological:  Negative for headaches.  Objective:  Temp (!) 100.4 F (38 C) (Oral)    Ht '5\' 8"'  (1.727 m)    Wt 171 lb (77.6 kg)    BMI 26.00 kg/m   Wt Readings from Last 3 Encounters:  01/30/21 171 lb (77.6 kg)  06/13/20 171 lb (77.6 kg)  06/01/20 171 lb (77.6 kg)       Physical exam: General: Alert and oriented x 3, no distress, does appear tired.  Pulmonary: Speaks in complete sentences without increased work of breathing, no cough during visit. Sounds nasally congested.   Psychiatric: Normal mood, thought content,  and behavior.     Results for orders placed or performed in visit on 09/07/20  CBC with Differential/Platelet  Result Value Ref Range   WBC 6.8 3.4 - 10.8 x10E3/uL   RBC 4.56 3.77 - 5.28 x10E6/uL   Hemoglobin 15.5 11.1 - 15.9 g/dL   Hematocrit 45.2 34.0 - 46.6 %   MCV 99 (H) 79 - 97 fL   MCH 34.0 (H) 26.6 - 33.0 pg   MCHC 34.3 31.5 - 35.7 g/dL   RDW 12.1 11.7 - 15.4 %   Platelets 205 150 - 450 x10E3/uL   Neutrophils 63 Not Estab. %   Lymphs 25 Not Estab. %   Monocytes 9 Not Estab. %   Eos 2 Not Estab. %   Basos 1  Not Estab. %   Neutrophils Absolute 4.4 1.4 - 7.0 x10E3/uL   Lymphocytes Absolute 1.7 0.7 - 3.1 x10E3/uL   Monocytes Absolute 0.6 0.1 - 0.9 x10E3/uL   EOS (ABSOLUTE) 0.1 0.0 - 0.4 x10E3/uL   Basophils Absolute 0.0 0.0 - 0.2 x10E3/uL   Immature Granulocytes 0 Not Estab. %   Immature Grans (Abs) 0.0 0.0 - 0.1 x10E3/uL  Comprehensive metabolic panel  Result Value Ref Range   Glucose 138 (H) 65 - 99 mg/dL   BUN 11 6 - 20 mg/dL   Creatinine, Ser 0.72 0.57 - 1.00 mg/dL   eGFR 109 >59 mL/min/1.73   BUN/Creatinine Ratio 15 9 - 23   Sodium 139 134 - 144 mmol/L   Potassium 4.2 3.5 - 5.2 mmol/L   Chloride 100 96 - 106 mmol/L   CO2 22 20 - 29 mmol/L   Calcium 9.0 8.7 - 10.2 mg/dL   Total Protein 6.6 6.0 - 8.5 g/dL   Albumin 4.7 3.8 - 4.8 g/dL   Globulin, Total 1.9 1.5 - 4.5 g/dL   Albumin/Globulin Ratio 2.5 (H) 1.2 - 2.2   Bilirubin Total 0.4 0.0 - 1.2 mg/dL   Alkaline Phosphatase 42 (L) 44 - 121 IU/L   AST 13 0 - 40 IU/L   ALT 10 0 - 32 IU/L   Assessment & Plan:   Problem List Items Addressed This Visit       Other   COVID-19 virus infection    Overall mild and tolerable, appears stable.   Offered antiviral treatment given her history of RA and also being on immunosuppressant therapy, she kindly declines as symptoms are mild and tolerable.  Continue Dayquil and Nyquil. Continue Tylenol PRN.   She will update if anything changes.        No orders of the defined types were placed in this encounter.  No orders of the defined types were placed in this encounter.   I discussed the assessment and treatment plan with the patient. The patient was provided an opportunity to ask questions and all were answered. The patient agreed with the plan and demonstrated an understanding of the instructions. The patient was advised to call back or seek an in-person evaluation if the symptoms worsen or if the condition fails to improve as anticipated.  Follow up plan:  Continue Dayquil and  Nyquil as needed.  Use Tylenol as needed.  Please reach out to me if anything changes!  It was a pleasure to see you today!   Pleas Koch, NP

## 2021-01-30 NOTE — Assessment & Plan Note (Addendum)
Overall mild and tolerable, appears stable.   Offered antiviral treatment given her history of RA and also being on immunosuppressant therapy, she kindly declines as symptoms are mild and tolerable.  Continue Dayquil and Nyquil. Continue Tylenol PRN.   She will update if anything changes. She understands quarantine recommendations.

## 2021-01-30 NOTE — Patient Instructions (Signed)
Continue Dayquil and Nyquil as needed.  Use Tylenol as needed.  Please reach out to me if anything changes!  It was a pleasure to see you today!

## 2021-02-08 ENCOUNTER — Telehealth: Payer: Self-pay | Admitting: Obstetrics and Gynecology

## 2021-02-08 DIAGNOSIS — E663 Overweight: Secondary | ICD-10-CM | POA: Diagnosis not present

## 2021-02-08 DIAGNOSIS — M0609 Rheumatoid arthritis without rheumatoid factor, multiple sites: Secondary | ICD-10-CM | POA: Diagnosis not present

## 2021-02-08 DIAGNOSIS — Z6826 Body mass index (BMI) 26.0-26.9, adult: Secondary | ICD-10-CM | POA: Diagnosis not present

## 2021-02-08 DIAGNOSIS — Z79899 Other long term (current) drug therapy: Secondary | ICD-10-CM | POA: Diagnosis not present

## 2021-02-08 DIAGNOSIS — M2550 Pain in unspecified joint: Secondary | ICD-10-CM | POA: Diagnosis not present

## 2021-02-08 DIAGNOSIS — Z7189 Other specified counseling: Secondary | ICD-10-CM | POA: Diagnosis not present

## 2021-02-08 NOTE — Telephone Encounter (Signed)
Pt is scheduled with [facility] on 6/1 for Mirena removal and reinsertion.

## 2021-02-09 NOTE — Telephone Encounter (Signed)
Noted. Will order to arrive by apt date/time. 

## 2021-03-07 NOTE — Telephone Encounter (Signed)
Can you take a look and make sure that the dx code used was right?

## 2021-03-27 DIAGNOSIS — H47393 Other disorders of optic disc, bilateral: Secondary | ICD-10-CM | POA: Diagnosis not present

## 2021-04-17 NOTE — Telephone Encounter (Signed)
I contacted patient via phone and mychart about appointment scheduling changes for 06/14/21. Patient is scheduled for 06/28/21 at 8:35 am with [facility]. Awaiting confirmation.

## 2021-05-16 DIAGNOSIS — M0609 Rheumatoid arthritis without rheumatoid factor, multiple sites: Secondary | ICD-10-CM | POA: Diagnosis not present

## 2021-05-16 DIAGNOSIS — Z79899 Other long term (current) drug therapy: Secondary | ICD-10-CM | POA: Diagnosis not present

## 2021-06-05 ENCOUNTER — Ambulatory Visit (INDEPENDENT_AMBULATORY_CARE_PROVIDER_SITE_OTHER): Payer: 59 | Admitting: Primary Care

## 2021-06-05 ENCOUNTER — Encounter: Payer: Self-pay | Admitting: Primary Care

## 2021-06-05 VITALS — BP 124/74 | HR 58 | Temp 98.6°F | Ht 68.0 in | Wt 177.0 lb

## 2021-06-05 DIAGNOSIS — M069 Rheumatoid arthritis, unspecified: Secondary | ICD-10-CM

## 2021-06-05 DIAGNOSIS — K219 Gastro-esophageal reflux disease without esophagitis: Secondary | ICD-10-CM

## 2021-06-05 DIAGNOSIS — Z9189 Other specified personal risk factors, not elsewhere classified: Secondary | ICD-10-CM | POA: Diagnosis not present

## 2021-06-05 DIAGNOSIS — E785 Hyperlipidemia, unspecified: Secondary | ICD-10-CM | POA: Diagnosis not present

## 2021-06-05 DIAGNOSIS — Z803 Family history of malignant neoplasm of breast: Secondary | ICD-10-CM | POA: Diagnosis not present

## 2021-06-05 DIAGNOSIS — Z1231 Encounter for screening mammogram for malignant neoplasm of breast: Secondary | ICD-10-CM | POA: Diagnosis not present

## 2021-06-05 DIAGNOSIS — Z0000 Encounter for general adult medical examination without abnormal findings: Secondary | ICD-10-CM | POA: Diagnosis not present

## 2021-06-05 LAB — LIPID PANEL
Cholesterol: 198 mg/dL (ref 0–200)
HDL: 56 mg/dL (ref >39.00)
LDL Cholesterol: 126 mg/dL — ABNORMAL HIGH (ref 0–99)
NonHDL: 141.54
Total CHOL/HDL Ratio: 4
Triglycerides: 78 mg/dL (ref 0.0–149.0)
VLDL: 15.6 mg/dL (ref 0.0–40.0)

## 2021-06-05 NOTE — Assessment & Plan Note (Signed)
Mammogram due, orders placed. MRI breasts UTD.  Continue annual screenings with MRI breasts and mammograms.

## 2021-06-05 NOTE — Assessment & Plan Note (Signed)
Immunizations UTD. Pap smear UTD. Follows with GYN.  Discussed the importance of a healthy diet and regular exercise in order for weight loss, and to reduce the risk of further co-morbidity.  Exam today stable Labs pending and also reviewed.

## 2021-06-05 NOTE — Assessment & Plan Note (Signed)
MRI breasts UTD, stable compared to prior year. Mammogram due in June 2023, orders placed.  Continue screening with MRI breasts and mammogram annually.

## 2021-06-05 NOTE — Patient Instructions (Signed)
Stop by the lab prior to leaving today. I will notify you of your results once received.  ? ?[facility] to schedule your mammogram.  ? ?It was a pleasure to see you today! ? ?Preventive Care 40-41 Years Old, Female ?Preventive care refers to lifestyle choices and visits with your health care provider that can promote health and wellness. Preventive care visits are also called wellness exams. ?What can I expect for my preventive care visit? ?Counseling ?Your health care provider may ask you questions about your: ?Medical history, including: ?Past medical problems. ?Family medical history. ?Pregnancy history. ?Current health, including: ?Menstrual cycle. ?Method of birth control. ?Emotional well-being. ?Home life and relationship well-being. ?Sexual activity and sexual health. ?Lifestyle, including: ?Alcohol, nicotine or tobacco, and drug use. ?Access to firearms. ?Diet, exercise, and sleep habits. ?Work and work environment. ?Sunscreen use. ?Safety issues such as seatbelt and bike helmet use. ?Physical exam ?Your health care provider will check your: ?Height and weight. These may be used to calculate your BMI (body mass index). BMI is a measurement that tells if you are at a healthy weight. ?Waist circumference. This measures the distance around your waistline. This measurement also tells if you are at a healthy weight and may help predict your risk of certain diseases, such as type 2 diabetes and high blood pressure. ?Heart rate and blood pressure. ?Body temperature. ?Skin for abnormal spots. ?What immunizations do I need? ? ?Vaccines are usually given at various ages, according to a schedule. Your health care provider will recommend vaccines for you based on your age, medical history, and lifestyle or other factors, such as travel or where you work. ?What tests do I need? ?Screening ?Your health care provider may recommend screening tests for certain conditions. This may include: ?Lipid and cholesterol  levels. ?Diabetes screening. This is done by checking your blood sugar (glucose) after you have not eaten for a while (fasting). ?Pelvic exam and Pap test. ?Hepatitis B test. ?Hepatitis C test. ?HIV (human immunodeficiency virus) test. ?STI (sexually transmitted infection) testing, if you are at risk. ?Lung cancer screening. ?Colorectal cancer screening. ?Mammogram. Talk with your health care provider about when you should start having regular mammograms. This may depend on whether you have a family history of breast cancer. ?BRCA-related cancer screening. This may be done if you have a family history of breast, ovarian, tubal, or peritoneal cancers. ?Bone density scan. This is done to screen for osteoporosis. ?Talk with your health care provider about your test results, treatment options, and if necessary, the need for more tests. ?Follow these instructions at home: ?Eating and drinking ? ?Eat a diet that includes fresh fruits and vegetables, whole grains, lean protein, and low-fat dairy products. ?Take vitamin and mineral supplements as recommended by your health care provider. ?Do not drink alcohol if: ?Your health care provider tells you not to drink. ?You are pregnant, may be pregnant, or are planning to become pregnant. ?If you drink alcohol: ?Limit how much you have to 0-1 drink a day. ?Know how much alcohol is in your drink. In the [place] one drink equals one 12 oz bottle of beer (355 mL), one 5 oz glass of wine (148 mL), or one 1? oz glass of hard liquor (44 mL). ?Lifestyle ?Brush your teeth every morning and night with fluoride toothpaste. Floss one time each day. ?Exercise for at least 30 minutes 5 or more days each week. ?Do not use any products that contain nicotine or tobacco. These products include cigarettes, chewing   tobacco, and vaping devices, such as e-cigarettes. If you need help quitting, ask your health care provider. Do not use drugs. If you are sexually active, practice safe sex. Use a  condom or other form of protection to prevent STIs. If you do not wish to become pregnant, use a form of birth control. If you plan to become pregnant, see your health care provider for a prepregnancy visit. Take aspirin only as told by your health care provider. Make sure that you understand how much to take and what form to take. Work with your health care provider to find out whether it is safe and beneficial for you to take aspirin daily. Find healthy ways to manage stress, such as: Meditation, yoga, or listening to music. Journaling. Talking to a trusted person. Spending time with friends and family. Minimize exposure to UV radiation to reduce your risk of skin cancer. Safety Always wear your seat belt while driving or riding in a vehicle. Do not drive: If you have been drinking alcohol. Do not ride with someone who has been drinking. When you are tired or distracted. While texting. If you have been using any mind-altering substances or drugs. Wear a helmet and other protective equipment during sports activities. If you have firearms in your house, make sure you follow all gun safety procedures. Seek help if you have been physically or sexually abused. What's next? Visit your health care provider once a year for an annual wellness visit. Ask your health care provider how often you should have your eyes and teeth checked. Stay up to date on all vaccines. This information is not intended to replace advice given to you by your health care provider. Make sure you discuss any questions you have with your health care provider. Document Revised: 06/28/2020 Document Reviewed: 06/28/2020 Elsevier Patient Education  [place].

## 2021-06-05 NOTE — Progress Notes (Signed)
Subjective:    Patient ID: Betty Burgess, female    DOB: [birth year], 41 y.o.   MRN: [id number]  HPI  Betty Burgess is a very pleasant 41 y.o. female who presents today for complete physical and follow up of chronic conditions.  Immunizations: -Tetanus: 2022 -Influenza: Completed last season  -Covid-19: 3 vaccines   Diet: Fair diet.  Exercise: Regular exercise.  Eye exam: Completes annually  Dental exam: Completes semi-annually   Pap Smear: Completed in 2022 Mammogram: Completed in June 2022      Review of Systems  Constitutional:  Negative for unexpected weight change.  HENT:  Negative for rhinorrhea.   Respiratory:  Negative for shortness of breath.   Cardiovascular:  Negative for chest pain.  Gastrointestinal:  Negative for constipation and diarrhea.  Genitourinary:  Negative for difficulty urinating and menstrual problem.  Musculoskeletal:  Negative for arthralgias and myalgias.  Skin:  Negative for rash.  Allergic/Immunologic: Negative for environmental allergies.  Neurological:  Negative for dizziness and headaches.  Psychiatric/Behavioral:  The patient is not nervous/anxious.         Past Medical History:  Diagnosis Date   BRCA negative    24 gene panel neg   Cervical dysplasia    COVID-19 virus infection 01/30/2021   Family history of BRCA1 gene positive    MGM   Family history of breast cancer    GERD (gastroesophageal reflux disease)    Heart murmur    RA (rheumatoid arthritis) ([place])     Social History   Socioeconomic History   Marital status: Married    Spouse name: Not on file   Number of children: Not on file   Years of education: Not on file   Highest education level: Not on file  Occupational History   Not on file  Tobacco Use   Smoking status: Every Day    Packs/day: 1.00    Types: Cigarettes   Smokeless tobacco: Never  Vaping Use   Vaping Use: Never used  Substance and Sexual Activity   Alcohol use: Yes   Drug use:  Never   Sexual activity: Yes    Birth control/protection: I.U.D.    Comment: Mirena  Other Topics Concern   Not on file  Social History Narrative   Married.   No children.   2 dogs.   Moved from [place].    Social Determinants of Health   Financial Resource Strain: Not on file  Food Insecurity: Not on file  Transportation Needs: Not on file  Physical Activity: Not on file  Stress: Not on file  Social Connections: Not on file  Intimate Partner Violence: Not on file    Past Surgical History:  Procedure Laterality Date   LEEP  ~2015   WRIST GANGLION EXCISION  2012    Family History  Problem Relation Age of Onset   Breast cancer Mother 70       inflam BRACCA -   Early death Mother    Alcohol abuse Father    Birth defects Father    Stroke Father    Lung cancer Father    Breast cancer Maternal Aunt 86       BRCA neg lobular   Hepatitis C Maternal Aunt    Hyperlipidemia Maternal Grandmother    Breast cancer Maternal Grandmother 69       BRCA 1 pos DCIS   Diabetes Maternal Grandmother    Hyperlipidemia Maternal Grandfather    Diabetes Maternal Grandfather  Arthritis Paternal Grandmother    Diabetes Paternal Grandmother    Stroke Paternal Grandfather     Allergies  Allergen Reactions   Hydroxychloroquine Sulfate Other (See Comments)   Pertussis Vaccines     Current Outpatient Medications on File Prior to Visit  Medication Sig Dispense Refill   cholecalciferol (VITAMIN D3) 25 MCG (1000 UNIT) tablet Take 1,000 Units by mouth daily.     famotidine (PEPCID) 20 MG tablet Take 1 tablet (20 mg total) by mouth daily. As needed for heartburn. 90 tablet 0   folic acid (FOLVITE) 1 MG tablet Take 1 mg by mouth.      levonorgestrel (MIRENA) 20 MCG/24HR IUD 1 each by Intrauterine route once.      methotrexate (RHEUMATREX) 2.5 MG tablet Take 2.5 mg by mouth once a week. Caution:Chemotherapy. Protect from light. 10 pills once a week.     No current facility-administered  medications on file prior to visit.    BP 124/74   Pulse (!) 58   Temp 98.6 F (37 C) (Oral)   Ht '5\' 8"'  (1.727 m)   Wt 177 lb (80.3 kg)   SpO2 100%   BMI 26.91 kg/m  Objective:   Physical Exam HENT:     Right Ear: Tympanic membrane and ear canal normal.     Left Ear: Tympanic membrane and ear canal normal.     Nose: Nose normal.  Eyes:     Conjunctiva/sclera: Conjunctivae normal.     Pupils: Pupils are equal, round, and reactive to light.  Neck:     Thyroid: No thyromegaly.  Cardiovascular:     Rate and Rhythm: Normal rate and regular rhythm.     Heart sounds: No murmur heard. Pulmonary:     Effort: Pulmonary effort is normal.     Breath sounds: Normal breath sounds. No rales.  Abdominal:     General: Bowel sounds are normal.     Palpations: Abdomen is soft.     Tenderness: There is no abdominal tenderness.  Musculoskeletal:        General: Normal range of motion.     Cervical back: Neck supple.  Lymphadenopathy:     Cervical: No cervical adenopathy.  Skin:    General: Skin is warm and dry.     Findings: No rash.  Neurological:     Mental Status: She is alert and oriented to person, place, and time.     Cranial Nerves: No cranial nerve deficit.     Deep Tendon Reflexes: Reflexes are normal and symmetric.  Psychiatric:        Mood and Affect: Mood normal.          Assessment & Plan:

## 2021-06-05 NOTE — Assessment & Plan Note (Signed)
Discussed the importance of a healthy diet and regular exercise in order for weight loss, and to reduce the risk of further co-morbidity.  Repeat lipid panel pending. 

## 2021-06-05 NOTE — Assessment & Plan Note (Signed)
Infrequent symptoms. Controlled.   Continue famotidine 20 mg PRN.

## 2021-06-05 NOTE — Assessment & Plan Note (Signed)
Controlled.  Continue methotrexate 25 mg daily. Following with rheumatology

## 2021-06-14 ENCOUNTER — Ambulatory Visit: Payer: 59 | Admitting: Obstetrics and Gynecology

## 2021-06-27 NOTE — Progress Notes (Signed)
PCP:  Pleas Koch, NP   Chief Complaint  Patient presents with   Gynecologic Exam     HPI:      [name] is a 41 y.o. G0P0000 whose LMP was No LMP recorded. (Menstrual status: IUD)., presents today for her annual examination.  Her menses are infrequent spotting with IUD, mild dysmen.  Sex activity: single partner, contraception - IUD. Mirena placed 04/03/15; was having occas spotting after sex but now happens every time, pinkish blood, lasting a few days. No pain. Pt would like IUD replaced to see if cause of sx.   Last Pap: 06/13/20  Results were: no abnormalities /neg HPV DNA 2021; S/P LEEP less than 10 yrs ago. Had neg margins. Does regular paps and normal paps since.  Hx of STDs: HPV  Last mammogram: 06/27/20  Results were: normal--routine follow-up in 12 months. Does yearly scr breast MRI (with PCP) with neg results 12/22. Has upcoming mammo sched 6/23 with PCP There is a FH of breast cancer in her mom, MGM, and mat aunt. Genetic testing has Burgess done. Her MGM was BRCA 1 positive. Pt is 24 gene panel neg. Still at increased risk based on FH per GC.  There is no FH of ovarian cancer. The patient does not do self-breast exams.  Tobacco use: smokes 1/2 ppd Alcohol use: none No drug use.  Exercise: very active  She does get adequate calcium and Vitamin D in her diet. Labs with PCP   Past Medical History:  Diagnosis Date   BRCA negative    24 gene panel neg   Cervical dysplasia    COVID-19 virus infection 01/30/2021   Family history of BRCA1 gene positive    MGM   Family history of breast cancer    GERD (gastroesophageal reflux disease)    Heart murmur    RA (rheumatoid arthritis) ([place])     Past Surgical History:  Procedure Laterality Date   LEEP  ~2015   WRIST GANGLION EXCISION  2012    Family History  Problem Relation Age of Onset   Breast cancer Mother 20       inflam BRACCA -   Early death Mother    Alcohol abuse Father    Birth defects  Father    Stroke Father    Lung cancer Father    Breast cancer Maternal Aunt 47       BRCA neg lobular   Hepatitis C Maternal Aunt    Hyperlipidemia Maternal Grandmother    Breast cancer Maternal Grandmother 54       BRCA 1 pos DCIS   Diabetes Maternal Grandmother    Hyperlipidemia Maternal Grandfather    Diabetes Maternal Grandfather    Arthritis Paternal Grandmother    Diabetes Paternal Grandmother    Stroke Paternal Grandfather     Social History   Socioeconomic History   Marital status: Married    Spouse name: Not on file   Number of children: Not on file   Years of education: Not on file   Highest education level: Not on file  Occupational History   Not on file  Tobacco Use   Smoking status: Every Day    Packs/day: 1.00    Types: Cigarettes   Smokeless tobacco: Never  Vaping Use   Vaping Use: Never used  Substance and Sexual Activity   Alcohol use: Yes   Drug use: Never   Sexual activity: Yes    Birth control/protection: I.U.D.  Comment: Mirena  Other Topics Concern   Not on file  Social History Narrative   Married.   No children.   2 dogs.   Moved from [place].    Social Determinants of Health   Financial Resource Strain: Not on file  Food Insecurity: Not on file  Transportation Needs: Not on file  Physical Activity: Not on file  Stress: Not on file  Social Connections: Not on file  Intimate Partner Violence: Not on file     Current Outpatient Medications:    cholecalciferol (VITAMIN D3) 25 MCG (1000 UNIT) tablet, Take 1,000 Units by mouth daily., Disp: , Rfl:    famotidine (PEPCID) 20 MG tablet, Take 1 tablet (20 mg total) by mouth daily. As needed for heartburn., Disp: 90 tablet, Rfl: 0   folic acid (FOLVITE) 1 MG tablet, Take 1 mg by mouth. , Disp: , Rfl:    methotrexate (RHEUMATREX) 2.5 MG tablet, Take 2.5 mg by mouth once a week. Caution:Chemotherapy. Protect from light. 10 pills once a week., Disp: , Rfl:    ROS:  Review of Systems   Constitutional:  Negative for fatigue, fever and unexpected weight change.  Respiratory:  Negative for cough, shortness of breath and wheezing.   Cardiovascular:  Negative for chest pain, palpitations and leg swelling.  Gastrointestinal:  Negative for blood in stool, constipation, diarrhea, nausea and vomiting.  Endocrine: Negative for cold intolerance, heat intolerance and polyuria.  Genitourinary:  Positive for vaginal bleeding. Negative for dyspareunia, dysuria, flank pain, frequency, genital sores, hematuria, menstrual problem, pelvic pain, urgency, vaginal discharge and vaginal pain.  Musculoskeletal:  Negative for back pain, joint swelling and myalgias.  Skin:  Negative for rash.  Neurological:  Negative for dizziness, syncope, light-headedness, numbness and headaches.  Hematological:  Negative for adenopathy.  Psychiatric/Behavioral:  Negative for agitation, confusion, sleep disturbance and suicidal ideas. The patient is not nervous/anxious.   BREAST: No symptoms   Objective: BP 118/70   Ht '5\' 9"'  (1.753 m)   Wt 174 lb (78.9 kg)   BMI 25.70 kg/m    Physical Exam Constitutional:      Appearance: She is well-developed.  Genitourinary:     Vulva normal.     Right Labia: No rash, tenderness or lesions.    Left Labia: No tenderness, lesions or rash.    No vaginal discharge, erythema or tenderness.      Right Adnexa: not tender and no mass present.    Left Adnexa: not tender and no mass present.    No cervical friability or polyp.     IUD strings visualized.     Uterus is not enlarged or tender.  Breasts:    Right: No mass, nipple discharge, skin change or tenderness.     Left: No mass, nipple discharge, skin change or tenderness.  Neck:     Thyroid: No thyromegaly.  Cardiovascular:     Rate and Rhythm: Normal rate and regular rhythm.     Heart sounds: Normal heart sounds. No murmur heard. Pulmonary:     Effort: Pulmonary effort is normal.     Breath sounds: Normal  breath sounds.  Abdominal:     Palpations: Abdomen is soft.     Tenderness: There is no abdominal tenderness. There is no guarding or rebound.  Musculoskeletal:        General: Normal range of motion.     Cervical back: Normal range of motion.  Lymphadenopathy:     Cervical: No cervical adenopathy.  Neurological:  General: No focal deficit present.     Mental Status: She is alert and oriented to person, place, and time.     Cranial Nerves: No cranial nerve deficit.  Skin:    General: Skin is warm and dry.  Psychiatric:        Mood and Affect: Mood normal.        Behavior: Behavior normal.        Thought Content: Thought content normal.        Judgment: Judgment normal.  Vitals reviewed.    IUD Removal Strings of IUD identified and grasped.  IUD removed without problem with ring forceps.  Pt tolerated this well.  IUD noted to be intact.  IUD Insertion Procedure Note Patient identified, informed consent performed, consent signed.   Discussed risks of irregular bleeding, cramping, infection, malpositioning or misplacement of the IUD outside the uterus which may require further procedure such as laparoscopy, risk of failure <1%. Time out was performed.    Speculum placed in the vagina.  Cervix visualized.  Cleaned with Betadine x 2.  Grasped anteriorly with a single tooth tenaculum.  Uterus sounded to 6.0 cm.   IUD placed per manufacturer's recommendations.  Strings trimmed to 3 cm. Tenaculum was removed, good hemostasis noted.  Patient tolerated procedure well.    Assessment/Plan: Encounter for annual routine gynecological examination  Cervical cancer screening - Plan: Cytology - PAP  Cervical dysplasia - Plan: Cytology - PAP  Encounter for routine checking of intrauterine contraceptive device (IUD); IUD strings in cx os; good till 3/25 but will replace today due to PCB.   Encounter for screening mammogram for malignant neoplasm of breast; pt does through PCP  Family  history of breast cancer--MyRisk neg  Increased risk of breast cancer--pt aware of monthly SBE, yearly CBE and mammos, and scr breast MRI. Does MRI with PCP. Cont Vit D supp.  PCB (post coital bleeding)--happens with sex and lasts a day or so; light pink d/c, for a couple yrs. Change IUD to see if sx improve. If not, will check GYN u/s. May be due to atrophic EM. F/u prn.   Encounter for removal and reinsertion of IUD - Plan: levonorgestrel (MIRENA) 20 MCG/DAY IUD 1 each  Meds ordered this encounter  Medications   levonorgestrel (MIRENA) 20 MCG/DAY IUD 1 each     Patient was given post-procedure instructions.  She was advised to have backup contraception for one week.   Call if you are having increasing pain, cramps or bleeding or if you have a fever greater than 100.4 degrees F., shaking chills, nausea or vomiting. Patient was also asked to check IUD strings periodically and follow up in 4 weeks for IUD check.   GYN counsel breast self exam, mammography screening, adequate intake of calcium and vitamin D, diet and exercise, tobacco cessation     F/U  Return in about 4 weeks (around 07/26/2021) for IUD f/u.  Jag Lenz B. Dezman Granda, PA-C 06/28/2021 11:45 AM

## 2021-06-28 ENCOUNTER — Ambulatory Visit (INDEPENDENT_AMBULATORY_CARE_PROVIDER_SITE_OTHER): Payer: 59 | Admitting: Obstetrics and Gynecology

## 2021-06-28 ENCOUNTER — Other Ambulatory Visit (HOSPITAL_COMMUNITY)
Admission: RE | Admit: 2021-06-28 | Discharge: 2021-06-28 | Disposition: A | Discharge status: Home / Self Care | Payer: 59 | Source: Ambulatory Visit | Attending: Obstetrics and Gynecology | Admitting: Obstetrics and Gynecology

## 2021-06-28 ENCOUNTER — Encounter: Payer: Self-pay | Admitting: Obstetrics and Gynecology

## 2021-06-28 VITALS — BP 118/70 | Ht 69.0 in | Wt 174.0 lb

## 2021-06-28 DIAGNOSIS — Z30431 Encounter for routine checking of intrauterine contraceptive device: Secondary | ICD-10-CM | POA: Diagnosis not present

## 2021-06-28 DIAGNOSIS — N879 Dysplasia of cervix uteri, unspecified: Secondary | ICD-10-CM | POA: Diagnosis not present

## 2021-06-28 DIAGNOSIS — Z30433 Encounter for removal and reinsertion of intrauterine contraceptive device: Secondary | ICD-10-CM | POA: Diagnosis not present

## 2021-06-28 DIAGNOSIS — Z01419 Encounter for gynecological examination (general) (routine) without abnormal findings: Secondary | ICD-10-CM

## 2021-06-28 DIAGNOSIS — Z9189 Other specified personal risk factors, not elsewhere classified: Secondary | ICD-10-CM

## 2021-06-28 DIAGNOSIS — Z1231 Encounter for screening mammogram for malignant neoplasm of breast: Secondary | ICD-10-CM

## 2021-06-28 DIAGNOSIS — N930 Postcoital and contact bleeding: Secondary | ICD-10-CM

## 2021-06-28 DIAGNOSIS — Z124 Encounter for screening for malignant neoplasm of cervix: Secondary | ICD-10-CM | POA: Insufficient documentation

## 2021-06-28 DIAGNOSIS — Z803 Family history of malignant neoplasm of breast: Secondary | ICD-10-CM

## 2021-06-28 MED ORDER — LEVONORGESTREL 20 MCG/DAY IU IUD
1.0000 | INTRAUTERINE_SYSTEM | Freq: Once | INTRAUTERINE | Status: AC
  Administered 2021-06-28: 1 via INTRAUTERINE

## 2021-06-28 NOTE — Patient Instructions (Signed)
I value your feedback and you entrusting us with your care. If you get a [facility] patient survey, I would appreciate you taking the time to let us know about your experience today. Thank you! ? ? ?

## 2021-07-02 LAB — CYTOLOGY - PAP
Diagnosis: NEGATIVE
Diagnosis: REACTIVE

## 2021-07-04 ENCOUNTER — Ambulatory Visit
Admission: RE | Admit: 2021-07-04 | Discharge: 2021-07-04 | Disposition: A | Discharge status: Home / Self Care | Payer: 59 | Source: Ambulatory Visit | Attending: Primary Care | Admitting: Primary Care

## 2021-07-04 DIAGNOSIS — Z1231 Encounter for screening mammogram for malignant neoplasm of breast: Secondary | ICD-10-CM | POA: Diagnosis not present

## 2021-07-27 ENCOUNTER — Ambulatory Visit (INDEPENDENT_AMBULATORY_CARE_PROVIDER_SITE_OTHER): Payer: 59 | Admitting: Family Medicine

## 2021-07-27 ENCOUNTER — Encounter: Payer: Self-pay | Admitting: Family Medicine

## 2021-07-27 VITALS — BP 102/60 | Wt 171.2 lb

## 2021-07-27 DIAGNOSIS — Z975 Presence of (intrauterine) contraceptive device: Secondary | ICD-10-CM | POA: Diagnosis not present

## 2021-07-31 ENCOUNTER — Encounter: Payer: Self-pay | Admitting: Family Medicine

## 2021-07-31 NOTE — Progress Notes (Signed)
   GYNECOLOGY PROBLEM  VISIT ENCOUNTER NOTE  Subjective:   MALASIA TORAIN is a 41 y.o. G0P0000 female here for a problem GYN visit.  Current complaints: none.   Denies abnormal vaginal bleeding, discharge, pelvic pain, problems with intercourse or other gynecologic concerns.    Gynecologic History No LMP recorded. (Menstrual status: IUD).  Contraception: IUD  There are no preventive care reminders to display for this patient.  The following portions of the patient's history were reviewed and updated as appropriate: allergies, current medications, past family history, past medical history, past social history, past surgical history and problem list.  Review of Systems Pertinent items are noted in HPI.   Objective:  BP 102/60   Wt 171 lb 3.2 oz (77.7 kg)   BMI 25.28 kg/m  Gen: well appearing, NAD HEENT: no scleral icterus CV: RR Lung: Normal WOB Ext: warm well perfused     Assessment and Plan:  1. IUD (intrauterine device) in place Happy with device  Keep in place  Please refer to After Visit Summary for other counseling recommendations.   No follow-ups on file.  Caren Macadam, MD, MPH, ABFM Attending [place] for [facility]

## 2021-08-01 DIAGNOSIS — H47393 Other disorders of optic disc, bilateral: Secondary | ICD-10-CM | POA: Diagnosis not present

## 2021-08-08 DIAGNOSIS — Z7189 Other specified counseling: Secondary | ICD-10-CM | POA: Diagnosis not present

## 2021-08-08 DIAGNOSIS — Z6826 Body mass index (BMI) 26.0-26.9, adult: Secondary | ICD-10-CM | POA: Diagnosis not present

## 2021-08-08 DIAGNOSIS — E663 Overweight: Secondary | ICD-10-CM | POA: Diagnosis not present

## 2021-08-08 DIAGNOSIS — Z79899 Other long term (current) drug therapy: Secondary | ICD-10-CM | POA: Diagnosis not present

## 2021-08-08 DIAGNOSIS — M2550 Pain in unspecified joint: Secondary | ICD-10-CM | POA: Diagnosis not present

## 2021-08-08 DIAGNOSIS — M0609 Rheumatoid arthritis without rheumatoid factor, multiple sites: Secondary | ICD-10-CM | POA: Diagnosis not present

## 2021-11-12 ENCOUNTER — Ambulatory Visit (INDEPENDENT_AMBULATORY_CARE_PROVIDER_SITE_OTHER): Payer: 59 | Admitting: Dermatology

## 2021-11-12 DIAGNOSIS — L814 Other melanin hyperpigmentation: Secondary | ICD-10-CM

## 2021-11-12 DIAGNOSIS — D239 Other benign neoplasm of skin, unspecified: Secondary | ICD-10-CM

## 2021-11-12 DIAGNOSIS — D2361 Other benign neoplasm of skin of right upper limb, including shoulder: Secondary | ICD-10-CM | POA: Diagnosis not present

## 2021-11-12 DIAGNOSIS — L578 Other skin changes due to chronic exposure to nonionizing radiation: Secondary | ICD-10-CM | POA: Diagnosis not present

## 2021-11-12 DIAGNOSIS — D229 Melanocytic nevi, unspecified: Secondary | ICD-10-CM

## 2021-11-12 DIAGNOSIS — Z1283 Encounter for screening for malignant neoplasm of skin: Secondary | ICD-10-CM

## 2021-11-12 DIAGNOSIS — D1801 Hemangioma of skin and subcutaneous tissue: Secondary | ICD-10-CM

## 2021-11-12 DIAGNOSIS — M0609 Rheumatoid arthritis without rheumatoid factor, multiple sites: Secondary | ICD-10-CM | POA: Diagnosis not present

## 2021-11-12 DIAGNOSIS — L700 Acne vulgaris: Secondary | ICD-10-CM

## 2021-11-12 DIAGNOSIS — L821 Other seborrheic keratosis: Secondary | ICD-10-CM

## 2021-11-12 NOTE — Patient Instructions (Addendum)
Recommend [facility] Hypochlorous Spray (found in the wound care section) OR Cln brand Acne or Sports wash. The [facility] Hypochlorous Spray can be sprayed on daily and left on. The Cln wash should be applied to the affected area daily for at least 30 seconds and then rinsed off. If you are using clindamycin solution or lotion or another topical antibiotic to treat acne, using a hypochlorous product may help lower the risk of antibiotic resistant bacteria.   OR Benzoyl peroxide (Panoxyl) wash over the counter daily in the shower.   Due to recent changes in healthcare laws, you may see results of your pathology and/or laboratory studies on MyChart before the doctors have had a chance to review them. We understand that in some cases there may be results that are confusing or concerning to you. Please understand that not all results are received at the same time and often the doctors may need to interpret multiple results in order to provide you with the best plan of care or course of treatment. Therefore, we ask that you please give [place] 2 business days to thoroughly review all your results before contacting the office for clarification. Should we see a critical lab result, you will be contacted sooner.   If You Need Anything After Your Visit  If you have any questions or concerns for your doctor, please call our main line at [phone] and press option 4 to reach your doctor's medical assistant. If no one answers, please leave a voicemail as directed and we will return your call as soon as possible. Messages left after 4 pm will be answered the following business day.   You may also send [place] a message via [place]. We typically respond to MyChart messages within 1-2 business days.  For prescription refills, please ask your pharmacy to contact our office. Our fax number is [phone].  If you have an urgent issue when the clinic is closed that cannot wait until the next business day, you can page your  doctor at the number below.    Please note that while we do our best to be available for urgent issues outside of office hours, we are not available 24/7.   If you have an urgent issue and are unable to reach [place], you may choose to seek medical care at your doctor's office, retail clinic, urgent care center, or emergency room.  If you have a medical emergency, please immediately call 911 or go to the emergency department.  Pager Numbers  - Dr. Nehemiah Massed: [phone]  - Dr. Laurence Ferrari: [phone]  - Dr. Nicole Kindred: [phone]  In the event of inclement weather, please call our main line at [phone] for an update on the status of any delays or closures.  Dermatology Medication Tips: Please keep the boxes that topical medications come in in order to help keep track of the instructions about where and how to use these. Pharmacies typically print the medication instructions only on the boxes and not directly on the medication tubes.   If your medication is too expensive, please contact our office at [phone] option 4 or send [place] a message through [place].   We are unable to tell what your co-pay for medications will be in advance as this is different depending on your insurance coverage. However, we may be able to find a substitute medication at lower cost or fill out paperwork to get insurance to cover a needed medication.   If a prior authorization is required to get your medication covered by  your insurance company, please allow [place] 1-2 business days to complete this process.  Drug prices often vary depending on where the prescription is filled and some pharmacies may offer cheaper prices.  The website www.goodrx.com contains coupons for medications through different pharmacies. The prices here do not account for what the cost may be with help from insurance (it may be cheaper with your insurance), but the website can give you the price if you did not use any insurance.  - You can print  the associated coupon and take it with your prescription to the pharmacy.  - You may also stop by our office during regular business hours and pick up a GoodRx coupon card.  - If you need your prescription sent electronically to a different pharmacy, notify our office through [facility] or by phone at [phone] option 4.     Si Usted Necesita Algo Despus de Su Visita  Tambin puede enviarnos un mensaje a travs de Pharmacist, community. Por lo general respondemos a los mensajes de MyChart en el transcurso de 1 a 2 das hbiles.  Para renovar recetas, por favor pida a su farmacia que se ponga en contacto con nuestra oficina. Harland Dingwall de fax es [place] [phone].  Si tiene un asunto urgente cuando la clnica est cerrada y que no puede esperar hasta el siguiente da hbil, puede llamar/localizar a su doctor(a) al nmero que aparece a continuacin.   Por favor, tenga en cuenta que aunque hacemos todo lo posible para estar disponibles para asuntos urgentes fuera del horario de [place], no estamos disponibles las 24 horas del da, los 7 das de la [place].   Si tiene un problema urgente y no puede comunicarse con nosotros, puede optar por buscar atencin mdica  en el consultorio de su doctor(a), en una clnica privada, en un centro de atencin urgente o en una sala de emergencias.  Si tiene Engineering geologist, por favor llame inmediatamente al 911 o vaya a la sala de emergencias.  Nmeros de bper  - Dr. Nehemiah Massed: [phone]  - Dra. Moye: [phone]  - Dra. Nicole Kindred: [phone]  En caso de inclemencias del [place], por favor llame a Johnsie Kindred principal al [phone] para una actualizacin sobre el [place] de cualquier retraso o cierre.  Consejos para la medicacin en dermatologa: Por favor, guarde las cajas en las que vienen los medicamentos de uso tpico para ayudarle a seguir las instrucciones sobre dnde y cmo usarlos. Las farmacias generalmente imprimen las  instrucciones del medicamento slo en las cajas y no directamente en los tubos del [place].   Si su medicamento es muy caro, por favor, pngase en contacto con Zigmund Daniel llamando al [phone] y presione la opcin 4 o envenos un mensaje a travs de Pharmacist, community.   No podemos decirle cul ser su copago por los medicamentos por adelantado ya que esto es diferente dependiendo de la cobertura de su seguro. Sin embargo, es posible que podamos encontrar un medicamento sustituto a Electrical engineer un formulario para que el seguro cubra el medicamento que se considera necesario.   Si se requiere una autorizacin previa para que su compaa de seguros [place] su medicamento, por favor permtanos de 1 a 2 das hbiles para completar este proceso.  Los precios de los medicamentos varan con frecuencia dependiendo del Environmental consultant de dnde se surte la receta y alguna farmacias pueden ofrecer precios ms baratos.  El sitio web www.goodrx.com tiene cupones para medicamentos de Airline pilot. Los precios aqu no tienen  en cuenta lo que podra costar con la ayuda del seguro (puede ser ms barato con su seguro), pero el sitio web puede darle el precio si no utiliz ningn seguro.  - Puede imprimir el cupn correspondiente y llevarlo con su receta a la farmacia.  - Tambin puede pasar por nuestra oficina durante el horario de atencin regular y recoger una tarjeta de cupones de GoodRx.  - Si necesita que su receta se enve electrnicamente a una farmacia diferente, informe a nuestra oficina a travs de MyChart de [place] o por telfono llamando al [phone] y presione la opcin 4.  

## 2021-11-12 NOTE — Progress Notes (Unsigned)
   New Patient Visit  Subjective  ROSARY FILOSA is a 41 y.o. female who presents for the following: Annual Exam. The patient presents for Total-Body Skin Exam (TBSE) for skin cancer screening and mole check.  The patient has spots, moles and lesions to be evaluated, some may be new or changing.  The following portions of the chart were reviewed this encounter and updated as appropriate:   Tobacco  Allergies  Meds  Problems  Med Hx  Surg Hx  Fam Hx     Review of Systems:  No other skin or systemic complaints except as noted in HPI or Assessment and Plan.  Objective  Well appearing patient in no apparent distress; mood and affect are within normal limits.  A full examination was performed including scalp, head, eyes, ears, nose, lips, neck, chest, axillae, abdomen, back, buttocks, bilateral upper extremities, bilateral lower extremities, hands, feet, fingers, toes, fingernails, and toenails. All findings within normal limits unless otherwise noted below.   Assessment & Plan  Acne vulgaris Buttocks  Recommend [facility] Hypochlorous Spray (found in the wound care section) OR Cln brand Acne or Sports wash. The [facility] Hypochlorous Spray can be sprayed on daily and left on. The Cln wash should be applied to the affected area daily for at least 30 seconds and then rinsed off. If you are using clindamycin solution or lotion or another topical antibiotic to treat acne, using a hypochlorous product may help lower the risk of antibiotic resistant bacteria.   OR BP wash daily in the shower like Panoxyl.  Lentigines - Scattered tan macules - Due to sun exposure - Benign-appearing, observe - Recommend daily broad spectrum sunscreen SPF 30+ to sun-exposed areas, reapply every 2 hours as needed. - Call for any changes  Seborrheic Keratoses - Stuck-on, waxy, tan-brown papules and/or plaques  - Benign-appearing - Discussed benign etiology and prognosis. - Observe - Call for any  changes  Melanocytic Nevi - Tan-brown and/or pink-flesh-colored symmetric macules and papules - Benign appearing on exam today - Observation - Call clinic for new or changing moles - Recommend daily use of broad spectrum spf 30+ sunscreen to sun-exposed areas.   Hemangiomas - Red papules - Discussed benign nature - Observe - Call for any changes  Actinic Damage - Chronic condition, secondary to cumulative UV/sun exposure - diffuse scaly erythematous macules with underlying dyspigmentation - Recommend daily broad spectrum sunscreen SPF 30+ to sun-exposed areas, reapply every 2 hours as needed.  - Staying in the shade or wearing long sleeves, sun glasses (UVA+UVB protection) and wide brim hats (4-inch brim around the entire circumference of the hat) are also recommended for sun protection.  - Call for new or changing lesions.  Dermatofibroma - R forearm  - Firm pink/brown papulenodule with dimple sign - Benign appearing - Call for any changes  Skin cancer screening performed today.  Return for FBSE in 2-3 years.  Luther Redo, CMA, am acting as scribe for Sarina Ser, MD . Documentation: I have reviewed the above documentation for accuracy and completeness, and I agree with the above.  Sarina Ser, MD

## 2021-11-13 ENCOUNTER — Encounter: Payer: Self-pay | Admitting: Dermatology

## 2022-02-08 DIAGNOSIS — Z6826 Body mass index (BMI) 26.0-26.9, adult: Secondary | ICD-10-CM | POA: Diagnosis not present

## 2022-02-08 DIAGNOSIS — E663 Overweight: Secondary | ICD-10-CM | POA: Diagnosis not present

## 2022-02-08 DIAGNOSIS — M0609 Rheumatoid arthritis without rheumatoid factor, multiple sites: Secondary | ICD-10-CM | POA: Diagnosis not present

## 2022-02-08 DIAGNOSIS — Z79899 Other long term (current) drug therapy: Secondary | ICD-10-CM | POA: Diagnosis not present

## 2022-02-08 DIAGNOSIS — Z7189 Other specified counseling: Secondary | ICD-10-CM | POA: Diagnosis not present

## 2022-04-03 DIAGNOSIS — H359 Unspecified retinal disorder: Secondary | ICD-10-CM | POA: Diagnosis not present

## 2022-04-03 DIAGNOSIS — H47393 Other disorders of optic disc, bilateral: Secondary | ICD-10-CM | POA: Diagnosis not present

## 2022-05-10 DIAGNOSIS — M0609 Rheumatoid arthritis without rheumatoid factor, multiple sites: Secondary | ICD-10-CM | POA: Diagnosis not present

## 2022-05-21 ENCOUNTER — Other Ambulatory Visit: Payer: Self-pay | Admitting: Primary Care

## 2022-05-21 DIAGNOSIS — Z1231 Encounter for screening mammogram for malignant neoplasm of breast: Secondary | ICD-10-CM

## 2022-06-07 ENCOUNTER — Ambulatory Visit: Payer: 59

## 2022-06-07 ENCOUNTER — Encounter: Payer: Self-pay | Admitting: *Deleted

## 2022-06-07 ENCOUNTER — Ambulatory Visit (INDEPENDENT_AMBULATORY_CARE_PROVIDER_SITE_OTHER): Payer: 59 | Admitting: Primary Care

## 2022-06-07 ENCOUNTER — Encounter: Payer: Self-pay | Admitting: Primary Care

## 2022-06-07 VITALS — BP 126/74 | HR 95 | Temp 97.9°F | Ht 69.0 in | Wt 164.0 lb

## 2022-06-07 DIAGNOSIS — D751 Secondary polycythemia: Secondary | ICD-10-CM | POA: Diagnosis not present

## 2022-06-07 DIAGNOSIS — M069 Rheumatoid arthritis, unspecified: Secondary | ICD-10-CM

## 2022-06-07 DIAGNOSIS — E785 Hyperlipidemia, unspecified: Secondary | ICD-10-CM

## 2022-06-07 DIAGNOSIS — Z803 Family history of malignant neoplasm of breast: Secondary | ICD-10-CM

## 2022-06-07 DIAGNOSIS — Z0000 Encounter for general adult medical examination without abnormal findings: Secondary | ICD-10-CM | POA: Diagnosis not present

## 2022-06-07 DIAGNOSIS — R718 Other abnormality of red blood cells: Secondary | ICD-10-CM | POA: Diagnosis not present

## 2022-06-07 DIAGNOSIS — Z9189 Other specified personal risk factors, not elsewhere classified: Secondary | ICD-10-CM

## 2022-06-07 DIAGNOSIS — K219 Gastro-esophageal reflux disease without esophagitis: Secondary | ICD-10-CM

## 2022-06-07 DIAGNOSIS — Z1231 Encounter for screening mammogram for malignant neoplasm of breast: Secondary | ICD-10-CM

## 2022-06-07 LAB — CBC WITH DIFFERENTIAL/PLATELET
Basophils Absolute: 0.1 10*3/uL (ref 0.0–0.1)
Basophils Relative: 1.1 % (ref 0.0–3.0)
Eosinophils Absolute: 0.1 10*3/uL (ref 0.0–0.7)
Eosinophils Relative: 2 % (ref 0.0–5.0)
HCT: 47.3 % — ABNORMAL HIGH (ref 36.0–46.0)
Hemoglobin: 16.1 g/dL — ABNORMAL HIGH (ref 12.0–15.0)
Lymphocytes Relative: 24.4 % (ref 12.0–46.0)
Lymphs Abs: 1.6 10*3/uL (ref 0.7–4.0)
MCHC: 34.1 g/dL (ref 30.0–36.0)
MCV: 99.6 fl (ref 78.0–100.0)
Monocytes Absolute: 0.5 10*3/uL (ref 0.1–1.0)
Monocytes Relative: 7.7 % (ref 3.0–12.0)
Neutro Abs: 4.2 10*3/uL (ref 1.4–7.7)
Neutrophils Relative %: 64.8 % (ref 43.0–77.0)
Platelets: 238 10*3/uL (ref 150.0–400.0)
RBC: 4.75 Mil/uL (ref 3.87–5.11)
RDW: 12.9 % (ref 11.5–15.5)
WBC: 6.5 10*3/uL (ref 4.0–10.5)

## 2022-06-07 LAB — COMPREHENSIVE METABOLIC PANEL
ALT: 18 U/L (ref 0–35)
AST: 18 U/L (ref 0–37)
Albumin: 4.5 g/dL (ref 3.5–5.2)
Alkaline Phosphatase: 43 U/L (ref 39–117)
BUN: 7 mg/dL (ref 6–23)
CO2: 29 mEq/L (ref 19–32)
Calcium: 9.1 mg/dL (ref 8.4–10.5)
Chloride: 101 mEq/L (ref 96–112)
Creatinine, Ser: 0.73 mg/dL (ref 0.40–1.20)
GFR: 101.98 mL/min (ref >60.00)
Glucose, Bld: 94 mg/dL (ref 70–99)
Potassium: 4.3 mEq/L (ref 3.5–5.1)
Sodium: 139 mEq/L (ref 135–145)
Total Bilirubin: 0.5 mg/dL (ref 0.2–1.2)
Total Protein: 6.8 g/dL (ref 6.0–8.3)

## 2022-06-07 LAB — TSH: TSH: 0.68 u[IU]/mL (ref 0.35–5.50)

## 2022-06-07 LAB — LIPID PANEL
Cholesterol: 161 mg/dL (ref 0–200)
HDL: 62.2 mg/dL (ref >39.00)
LDL Cholesterol: 87 mg/dL (ref 0–99)
NonHDL: 98.59
Total CHOL/HDL Ratio: 3
Triglycerides: 59 mg/dL (ref 0.0–149.0)
VLDL: 11.8 mg/dL (ref 0.0–40.0)

## 2022-06-07 LAB — VITAMIN B12: Vitamin B-12: 391 pg/mL (ref 211–911)

## 2022-06-07 MED ORDER — FAMOTIDINE 20 MG PO TABS
20.0000 mg | ORAL_TABLET | Freq: Every day | ORAL | 0 refills | Status: AC
Start: 2022-06-07 — End: ?

## 2022-06-07 NOTE — Addendum Note (Signed)
Addended by: Alvina Chou on: 06/07/2022 04:41 PM   Modules accepted: Orders

## 2022-06-07 NOTE — Assessment & Plan Note (Signed)
Overdue for MR breasts. Ordered and pending.  Proceed with mammogram as scheduled.

## 2022-06-07 NOTE — Progress Notes (Signed)
Subjective:    Patient ID: Betty Burgess, female    DOB: [birth year], 42 y.o.   MRN: [id number]  HPI  Betty Burgess is a very pleasant 42 y.o. female who presents today for complete physical and follow up of chronic conditions.  Immunizations: -Tetanus: Completed in 2022  Diet: Fair diet.  Exercise: Active daily  Eye exam: Completes annually  Dental exam: Completes every 3 months     Pap Smear: 2023 Mammogram: June 2023, scheduled for July 2024. MRI breasts due.    BP Readings from Last 3 Encounters:  06/07/22 126/74  07/27/21 102/60  06/28/21 118/70      Review of Systems  Constitutional:  Negative for unexpected weight change.  HENT:  Negative for rhinorrhea.   Respiratory:  Negative for cough and shortness of breath.   Cardiovascular:  Negative for chest pain.  Gastrointestinal:  Negative for constipation and diarrhea.  Genitourinary:  Negative for difficulty urinating.       Hot flashes  Musculoskeletal:  Positive for arthralgias.  Skin:  Negative for rash.  Allergic/Immunologic: Negative for environmental allergies.  Neurological:  Negative for dizziness and headaches.  Psychiatric/Behavioral:  The patient is not nervous/anxious.          Past Medical History:  Diagnosis Date   BRCA negative    24 gene panel neg   Cervical dysplasia    COVID-19 virus infection 01/30/2021   Family history of BRCA1 gene positive    MGM   Family history of breast cancer    GERD (gastroesophageal reflux disease)    Heart murmur    RA (rheumatoid arthritis) (HCC)     Social History   Socioeconomic History   Marital status: Married    Spouse name: Not on file   Number of children: Not on file   Years of education: Not on file   Highest education level: Not on file  Occupational History   Not on file  Tobacco Use   Smoking status: Every Day    Packs/day: 1    Types: Cigarettes   Smokeless tobacco: Never  Vaping Use   Vaping Use: Never used  Substance  and Sexual Activity   Alcohol use: Yes   Drug use: Never   Sexual activity: Yes    Birth control/protection: I.U.D.    Comment: Mirena  Other Topics Concern   Not on file  Social History Narrative   Married.   No children.   2 dogs.   Moved from [place].    Social Determinants of Health   Financial Resource Strain: Not on file  Food Insecurity: Not on file  Transportation Needs: Not on file  Physical Activity: Not on file  Stress: Not on file  Social Connections: Not on file  Intimate Partner Violence: Not on file    Past Surgical History:  Procedure Laterality Date   LEEP  ~2015   WRIST GANGLION EXCISION  2012    Family History  Problem Relation Age of Onset   Breast cancer Mother 37       inflam BRACCA -   Early death Mother    Alcohol abuse Father    Birth defects Father    Stroke Father    Lung cancer Father    Breast cancer Maternal Aunt 39       BRCA neg lobular   Hepatitis C Maternal Aunt    Hyperlipidemia Maternal Grandmother    Breast cancer Maternal Grandmother 81  BRCA 1 pos DCIS   Diabetes Maternal Grandmother    Hyperlipidemia Maternal Grandfather    Diabetes Maternal Grandfather    Arthritis Paternal Grandmother    Diabetes Paternal Grandmother    Stroke Paternal Grandfather     Allergies  Allergen Reactions   Hydroxychloroquine Sulfate Other (See Comments)   Pertussis Vaccines     Current Outpatient Medications on File Prior to Visit  Medication Sig Dispense Refill   cholecalciferol (VITAMIN D3) 25 MCG (1000 UNIT) tablet Take 1,000 Units by mouth daily.     folic acid (FOLVITE) 1 MG tablet Take 1 mg by mouth.      methotrexate (RHEUMATREX) 2.5 MG tablet Take 2.5 mg by mouth once a week. Caution:Chemotherapy. Protect from light. 10 pills once a week.     No current facility-administered medications on file prior to visit.    BP 126/74   Pulse 95   Temp 97.9 F (36.6 C) (Temporal)   Ht 5\' 9"  (1.753 m)   Wt 164 lb (74.4 kg)    SpO2 98%   BMI 24.22 kg/m  Objective:   Physical Exam HENT:     Right Ear: Tympanic membrane and ear canal normal.     Left Ear: Tympanic membrane and ear canal normal.     Nose: Nose normal.  Eyes:     Conjunctiva/sclera: Conjunctivae normal.     Pupils: Pupils are equal, round, and reactive to light.  Neck:     Thyroid: No thyromegaly.  Cardiovascular:     Rate and Rhythm: Normal rate and regular rhythm.     Heart sounds: No murmur heard. Pulmonary:     Effort: Pulmonary effort is normal.     Breath sounds: Normal breath sounds. No rales.  Abdominal:     General: Bowel sounds are normal.     Palpations: Abdomen is soft.     Tenderness: There is no abdominal tenderness.  Musculoskeletal:        General: Normal range of motion.     Cervical back: Neck supple.  Lymphadenopathy:     Cervical: No cervical adenopathy.  Skin:    General: Skin is warm and dry.     Findings: No rash.  Neurological:     Mental Status: She is alert and oriented to person, place, and time.     Cranial Nerves: No cranial nerve deficit.     Deep Tendon Reflexes: Reflexes are normal and symmetric.  Psychiatric:        Mood and Affect: Mood normal.           Assessment & Plan:  Preventative health care Assessment & Plan: Immunizations UTD. Pap smear UTD. Mammogram scheduled. MRI breasts due and ordered/  Discussed the importance of a healthy diet and regular exercise in order for weight loss, and to reduce the risk of further co-morbidity.  Exam stable. Labs pending.  Follow up in 1 year for repeat physical.    Rheumatoid arthritis, involving unspecified site, unspecified whether rheumatoid factor present [facility]) Assessment & Plan: Recent flare. She will schedule a follow up visit.   Following with rheumatology. Continue methotrexate 25 mg weekly.  Continue folic acid 1 mg daily.    Orders: -     TSH -     Lipid panel -     Comprehensive metabolic panel -     CBC with  Differential/Platelet -     Vitamin B12  Gastroesophageal reflux disease, unspecified whether esophagitis present Assessment & Plan: Controlled.  Continue famotidine 20 mg daily as needed. Refill(s) sent to pharmacy.   Orders: -     Famotidine; Take 1 tablet (20 mg total) by mouth daily. As needed for heartburn.  Dispense: 90 tablet; Refill: 0  Hyperlipidemia, unspecified hyperlipidemia type Assessment & Plan: Commended her on a fair diet and activity level.  Repeat lipid panel pending.  Orders: -     TSH -     Lipid panel -     Comprehensive metabolic panel -     CBC with Differential/Platelet -     Vitamin B12  Family history of breast cancer in first degree relative Assessment & Plan: Overdue for MR breasts. Ordered and pending.  Proceed with mammogram as scheduled.   Orders: -     MR BREAST BILATERAL W WO CONTRAST INC CAD; Future  Screening mammogram for breast cancer -     MR BREAST BILATERAL W WO CONTRAST INC CAD; Future  Increased risk of breast cancer Assessment & Plan: MR breasts ordered and pending. Proceed with mammogram as scheduled.   Orders: -     MR BREAST BILATERAL W WO CONTRAST INC CAD; Future        Doreene Nest, NP

## 2022-06-07 NOTE — Assessment & Plan Note (Signed)
Controlled.  Continue famotidine 20 mg daily as needed. Refill(s) sent to pharmacy.

## 2022-06-07 NOTE — Assessment & Plan Note (Signed)
Immunizations UTD. Pap smear UTD. Mammogram scheduled. MRI breasts due and ordered/  Discussed the importance of a healthy diet and regular exercise in order for weight loss, and to reduce the risk of further co-morbidity.  Exam stable. Labs pending.  Follow up in 1 year for repeat physical.

## 2022-06-07 NOTE — Assessment & Plan Note (Signed)
Commended her on a fair diet and activity level.  Repeat lipid panel pending.

## 2022-06-07 NOTE — Patient Instructions (Signed)
Stop by the lab prior to leaving today. I will notify you of your results once received.   You will either be contacted via phone regarding your MRI, or you may receive a letter on your MyChart portal from our referral team with instructions for scheduling an appointment. Please let us know if you have not been contacted by anyone within two weeks.  It was a pleasure to see you today!

## 2022-06-07 NOTE — Assessment & Plan Note (Signed)
MR breasts ordered and pending. Proceed with mammogram as scheduled.

## 2022-06-07 NOTE — Assessment & Plan Note (Addendum)
Recent flare. She will schedule a follow up visit.   Following with rheumatology. Continue methotrexate 25 mg weekly.  Continue folic acid 1 mg daily.

## 2022-06-08 ENCOUNTER — Encounter: Payer: Self-pay | Admitting: Obstetrics and Gynecology

## 2022-06-11 ENCOUNTER — Ambulatory Visit (INDEPENDENT_AMBULATORY_CARE_PROVIDER_SITE_OTHER): Payer: 59

## 2022-06-11 DIAGNOSIS — D751 Secondary polycythemia: Secondary | ICD-10-CM

## 2022-06-11 LAB — PATHOLOGIST SMEAR REVIEW

## 2022-06-11 LAB — T4, FREE: Free T4: 1.02 ng/dL (ref 0.60–1.60)

## 2022-07-31 NOTE — Progress Notes (Signed)
PCP:  Doreene Nest, NP   Chief Complaint  Patient presents with   Gynecologic Exam    No concerns     HPI:      Betty Burgess is a 42 y.o. G0P0000 whose LMP was No LMP recorded. (Menstrual status: IUD)., presents today for her annual examination.  Her menses are absent with IUD, no BTB/dysmen.   Sex activity: single partner, contraception - IUD. Mirena REplaced 06/28/21. was having occas spotting after sex that resolved with replacement.  Last Pap: 06/28/21 Results were: no abnormalities /neg HPV DNA 2021; S/P LEEP less than 10 yrs ago. Had neg margins. Does regular paps and normal paps since.  Hx of STDs: HPV Has noticed growth on LT labia that is bothersome. Would like removed if possible.   Last mammogram: 07/04/21 Results were: normal--routine follow-up in 12 months. Does yearly scr breast MRI (with PCP) with neg results 12/22. Has upcoming mammo sched 7/24 with PCP There is a FH of breast cancer in her mom, MGM, and mat aunt. Genetic testing has been done. Her MGM was BRCA 1 positive. Pt is 24 gene panel neg. Still at increased risk based on FH per GC.  There is no FH of ovarian cancer. The patient does not do self-breast exams.  Tobacco use: smokes 1/2 ppd, trying to quit Alcohol use: none No drug use.  Exercise: mod active d/t knee pain with RA  She does get adequate calcium and Vitamin D in her diet. Labs with PCP   Past Medical History:  Diagnosis Date   BRCA negative    24 gene panel neg   Cervical dysplasia    COVID-19 virus infection 01/30/2021   Family history of BRCA1 gene positive    MGM   Family history of breast cancer    GERD (gastroesophageal reflux disease)    Heart murmur    RA (rheumatoid arthritis) (HCC)     Past Surgical History:  Procedure Laterality Date   LEEP  ~2015   WRIST GANGLION EXCISION  2012    Family History  Problem Relation Age of Onset   Breast cancer Mother 55       inflam BRACCA -   Early death Mother     Alcohol abuse Father    Birth defects Father    Stroke Father    Lung cancer Father    Breast cancer Maternal Aunt 47       BRCA neg lobular   Hepatitis C Maternal Aunt    Hyperlipidemia Maternal Grandmother    Breast cancer Maternal Grandmother 54       BRCA 1 pos DCIS   Diabetes Maternal Grandmother    Hyperlipidemia Maternal Grandfather    Diabetes Maternal Grandfather    Arthritis Paternal Grandmother    Diabetes Paternal Grandmother    Stroke Paternal Grandfather     Social History   Socioeconomic History   Marital status: Married    Spouse name: Not on file   Number of children: Not on file   Years of education: Not on file   Highest education level: Not on file  Occupational History   Not on file  Tobacco Use   Smoking status: Every Day    Current packs/day: 1.00    Types: Cigarettes   Smokeless tobacco: Never  Vaping Use   Vaping status: Never Used  Substance and Sexual Activity   Alcohol use: Yes   Drug use: Never   Sexual activity: Yes  Birth control/protection: I.U.D.    Comment: Mirena  Other Topics Concern   Not on file  Social History Narrative   Married.   No children.   2 dogs.   Moved from [place].    Social Determinants of Health   Financial Resource Strain: Not on file  Food Insecurity: Not on file  Transportation Needs: Not on file  Physical Activity: Not on file  Stress: Not on file  Social Connections: Not on file  Intimate Partner Violence: Not on file     Current Outpatient Medications:    cholecalciferol (VITAMIN D3) 25 MCG (1000 UNIT) tablet, Take 1,000 Units by mouth daily., Disp: , Rfl:    famotidine (PEPCID) 20 MG tablet, Take 1 tablet (20 mg total) by mouth daily. As needed for heartburn., Disp: 90 tablet, Rfl: 0   folic acid (FOLVITE) 1 MG tablet, Take 1 mg by mouth. , Disp: , Rfl:    levonorgestrel (MIRENA) 20 MCG/DAY IUD, 1 each by Intrauterine route once., Disp: , Rfl:    methotrexate (RHEUMATREX) 2.5 MG tablet,  Take 2.5 mg by mouth once a week. Caution:Chemotherapy. Protect from light. 10 pills once a week., Disp: , Rfl:    ROS:  Review of Systems  Constitutional:  Negative for fatigue, fever and unexpected weight change.  Respiratory:  Negative for cough, shortness of breath and wheezing.   Cardiovascular:  Negative for chest pain, palpitations and leg swelling.  Gastrointestinal:  Negative for blood in stool, constipation, diarrhea, nausea and vomiting.  Endocrine: Negative for cold intolerance, heat intolerance and polyuria.  Genitourinary:  Negative for dyspareunia, dysuria, flank pain, frequency, genital sores, hematuria, menstrual problem, pelvic pain, urgency, vaginal bleeding, vaginal discharge and vaginal pain.  Musculoskeletal:  Positive for arthralgias. Negative for back pain, joint swelling and myalgias.  Skin:  Negative for rash.  Neurological:  Negative for dizziness, syncope, light-headedness, numbness and headaches.  Hematological:  Negative for adenopathy.  Psychiatric/Behavioral:  Negative for agitation, confusion, sleep disturbance and suicidal ideas. The patient is not nervous/anxious.   BREAST: No symptoms   Objective: BP 124/80   Ht 5\' 9"  (1.753 m)   Wt 167 lb (75.8 kg)   BMI 24.66 kg/m    Physical Exam Constitutional:      Appearance: She is well-developed.  Genitourinary:     Vulva normal.     Right Labia: No rash, tenderness or lesions.    Left Labia: No tenderness, lesions or rash.       No vaginal discharge, erythema or tenderness.      Right Adnexa: not tender and no mass present.    Left Adnexa: not tender and no mass present.    No cervical friability or polyp.     IUD strings visualized.     Uterus is not enlarged or tender.  Breasts:    Right: No mass, nipple discharge, skin change or tenderness.     Left: No mass, nipple discharge, skin change or tenderness.  Neck:     Thyroid: No thyromegaly.  Cardiovascular:     Rate and Rhythm: Normal  rate and regular rhythm.     Heart sounds: Normal heart sounds. No murmur heard. Pulmonary:     Effort: Pulmonary effort is normal.     Breath sounds: Normal breath sounds.  Abdominal:     Palpations: Abdomen is soft.     Tenderness: There is no abdominal tenderness. There is no guarding or rebound.  Musculoskeletal:        General:  Normal range of motion.     Cervical back: Normal range of motion.  Lymphadenopathy:     Cervical: No cervical adenopathy.  Neurological:     General: No focal deficit present.     Mental Status: She is alert and oriented to person, place, and time.     Cranial Nerves: No cranial nerve deficit.  Skin:    General: Skin is warm and dry.  Psychiatric:        Mood and Affect: Mood normal.        Behavior: Behavior normal.        Thought Content: Thought content normal.        Judgment: Judgment normal.  Vitals reviewed.     Assessment/Plan: Encounter for annual routine gynecological examination  Encounter for routine checking of intrauterine contraceptive device (IUD)--IUD strings in cx os, has 8 yr indication  Encounter for screening mammogram for malignant neoplasm of breast; pt has appt  Family history of breast cancer--pt is gene neg  Increased risk of breast cancer--aware of recommendations of monthly SBE, yearly CBE and mammos, as well as scr breast MRI. Will schedule MRI after mammo  Skin tag--refer to Dr. Logan Bores for removal.     GYN counsel breast self exam, mammography screening, adequate intake of calcium and vitamin D, diet and exercise, tobacco cessation     F/U  Return in about 1 year (around 08/01/2023).  Betty Burgess B. Doyne Micke, PA-C 08/01/2022 9:39 AM

## 2022-08-01 ENCOUNTER — Encounter: Payer: Self-pay | Admitting: Obstetrics and Gynecology

## 2022-08-01 ENCOUNTER — Ambulatory Visit (INDEPENDENT_AMBULATORY_CARE_PROVIDER_SITE_OTHER): Payer: 59 | Admitting: Obstetrics and Gynecology

## 2022-08-01 VITALS — BP 124/80 | Ht 69.0 in | Wt 167.0 lb

## 2022-08-01 DIAGNOSIS — Z01419 Encounter for gynecological examination (general) (routine) without abnormal findings: Secondary | ICD-10-CM | POA: Diagnosis not present

## 2022-08-01 DIAGNOSIS — Z30431 Encounter for routine checking of intrauterine contraceptive device: Secondary | ICD-10-CM

## 2022-08-01 DIAGNOSIS — Z9189 Other specified personal risk factors, not elsewhere classified: Secondary | ICD-10-CM

## 2022-08-01 DIAGNOSIS — L918 Other hypertrophic disorders of the skin: Secondary | ICD-10-CM

## 2022-08-01 DIAGNOSIS — Z803 Family history of malignant neoplasm of breast: Secondary | ICD-10-CM

## 2022-08-01 DIAGNOSIS — Z1231 Encounter for screening mammogram for malignant neoplasm of breast: Secondary | ICD-10-CM

## 2022-08-01 DIAGNOSIS — N930 Postcoital and contact bleeding: Secondary | ICD-10-CM

## 2022-08-01 NOTE — Patient Instructions (Signed)
I value your feedback and you entrusting us with your care. If you get a [facility] patient survey, I would appreciate you taking the time to let us know about your experience today. Thank you! ? ? ?

## 2022-08-09 ENCOUNTER — Ambulatory Visit
Admission: RE | Admit: 2022-08-09 | Discharge: 2022-08-09 | Disposition: A | Discharge status: Home / Self Care | Payer: 59 | Source: Ambulatory Visit | Attending: Primary Care | Admitting: Primary Care

## 2022-08-09 DIAGNOSIS — Z1231 Encounter for screening mammogram for malignant neoplasm of breast: Secondary | ICD-10-CM | POA: Diagnosis not present

## 2022-08-09 DIAGNOSIS — M0609 Rheumatoid arthritis without rheumatoid factor, multiple sites: Secondary | ICD-10-CM | POA: Diagnosis not present

## 2022-08-09 DIAGNOSIS — Z6825 Body mass index (BMI) 25.0-25.9, adult: Secondary | ICD-10-CM | POA: Diagnosis not present

## 2022-08-09 DIAGNOSIS — Z79899 Other long term (current) drug therapy: Secondary | ICD-10-CM | POA: Diagnosis not present

## 2022-08-09 DIAGNOSIS — E663 Overweight: Secondary | ICD-10-CM | POA: Diagnosis not present

## 2022-08-27 ENCOUNTER — Ambulatory Visit (INDEPENDENT_AMBULATORY_CARE_PROVIDER_SITE_OTHER): Payer: 59 | Admitting: Obstetrics and Gynecology

## 2022-08-27 ENCOUNTER — Other Ambulatory Visit (HOSPITAL_COMMUNITY)
Admission: RE | Admit: 2022-08-27 | Discharge: 2022-08-27 | Disposition: A | Discharge status: Home / Self Care | Payer: 59 | Source: Ambulatory Visit | Attending: Obstetrics and Gynecology | Admitting: Obstetrics and Gynecology

## 2022-08-27 ENCOUNTER — Encounter: Payer: Self-pay | Admitting: Obstetrics and Gynecology

## 2022-08-27 VITALS — BP 114/70 | HR 86 | Ht 69.0 in | Wt 174.8 lb

## 2022-08-27 DIAGNOSIS — N9089 Other specified noninflammatory disorders of vulva and perineum: Secondary | ICD-10-CM | POA: Insufficient documentation

## 2022-08-27 NOTE — Progress Notes (Signed)
HPI:      [name] is a 42 y.o. G0P0000 who LMP was No LMP recorded. (Menstrual status: IUD).  Subjective:   She presents today as a referral from [place] for a skin tag removal.  The patient states that has been there for approximately 3 months.  It is annoying to her and gets in the way sometimes.    Hx: The following portions of the patient's history were reviewed and updated as appropriate:             She  has a past medical history of BRCA negative, Cervical dysplasia, COVID-19 virus infection (01/30/2021), Family history of BRCA1 gene positive, Family history of breast cancer, GERD (gastroesophageal reflux disease), Heart murmur, and RA (rheumatoid arthritis) (HCC). She does not have any pertinent problems on file. She  has a past surgical history that includes Wrist ganglion excision (2012) and LEEP (~2015). Her family history includes Alcohol abuse in her father; Arthritis in her paternal grandmother; Birth defects in her father; Breast cancer (age of onset: 4) in her maternal aunt; Breast cancer (age of onset: 41) in her mother; Breast cancer (age of onset: 57) in her maternal grandmother; Diabetes in her maternal grandfather, maternal grandmother, and paternal grandmother; Early death in her mother; Hepatitis C in her maternal aunt; Hyperlipidemia in her maternal grandfather and maternal grandmother; Lung cancer in her father; Stroke in her father and paternal grandfather. She  reports that she has been smoking cigarettes. She has never used smokeless tobacco. She reports current alcohol use. She reports that she does not use drugs. She has a current medication list which includes the following prescription(s): cholecalciferol, famotidine, folic acid, levonorgestrel, and methotrexate. She is allergic to hydroxychloroquine sulfate and pertussis vaccines.       Review of Systems:  Review of Systems  Constitutional: Denied constitutional symptoms, night sweats, recent illness,  fatigue, fever, insomnia and weight loss.  Eyes: Denied eye symptoms, eye pain, photophobia, vision change and visual disturbance.  Ears/Nose/Throat/Neck: Denied ear, nose, throat or neck symptoms, hearing loss, nasal discharge, sinus congestion and sore throat.  Cardiovascular: Denied cardiovascular symptoms, arrhythmia, chest pain/pressure, edema, exercise intolerance, orthopnea and palpitations.  Respiratory: Denied pulmonary symptoms, asthma, pleuritic pain, productive sputum, cough, dyspnea and wheezing.  Gastrointestinal: Denied, gastro-esophageal reflux, melena, nausea and vomiting.  Genitourinary: See HPI for additional information.  Musculoskeletal: Denied musculoskeletal symptoms, stiffness, swelling, muscle weakness and myalgia.  Dermatologic: Denied dermatology symptoms, rash and scar.  Neurologic: Denied neurology symptoms, dizziness, headache, neck pain and syncope.  Psychiatric: Denied psychiatric symptoms, anxiety and depression.  Endocrine: Denied endocrine symptoms including hot flashes and night sweats.   Meds:   Current Outpatient Medications on File Prior to Visit  Medication Sig Dispense Refill   cholecalciferol (VITAMIN D3) 25 MCG (1000 UNIT) tablet Take 1,000 Units by mouth daily.     famotidine (PEPCID) 20 MG tablet Take 1 tablet (20 mg total) by mouth daily. As needed for heartburn. 90 tablet 0   folic acid (FOLVITE) 1 MG tablet Take 1 mg by mouth.      levonorgestrel (MIRENA) 20 MCG/DAY IUD 1 each by Intrauterine route once.     methotrexate (RHEUMATREX) 2.5 MG tablet Take 2.5 mg by mouth once a week. Caution:Chemotherapy. Protect from light. 10 pills once a week.     No current facility-administered medications on file prior to visit.      Objective:     Vitals:   08/27/22 1451  BP: 114/70  Pulse: 86   Filed Weights   08/27/22 1451  Weight: 174 lb 12.8 oz (79.3 kg)              Physical examination   Pelvic:   Vulva: Normal appearance.  No  lesions.  3 cm skin tag on left labia  Vagina: No lesions or abnormalities noted.  Support: Normal pelvic support.  Urethra No masses tenderness or scarring.  Meatus Normal size without lesions or prolapse.     Anus: Normal exam.  No lesions.  Perineum: Normal exam.  No lesions.   Procedure:  Area cleansed with Betadine solution.  Base of skin tag injected with lidocaine.  Skin tag systematically removed using both cut and coag combination bland.  Hemostasis excellent.  Single subcuticular suture of 4-0 Vicryl placed for approximation purposes.  Patient tolerated the procedure without pain or issue.          Assessment:    G0P0000 Patient Active Problem List   Diagnosis Date Noted   Hyperlipidemia 06/05/2021   Increased risk of breast cancer 06/13/2020   Cervical dysplasia 06/02/2019   Preventative health care 05/10/2019   Rheumatoid arthritis (HCC) 12/25/2018   GERD (gastroesophageal reflux disease) 12/25/2018   Family history of breast cancer in first degree relative 12/25/2018     1. Labial skin tag        Plan:            1.  Care instructions given.  Plan follow-up in 2 weeks. Orders No orders of the defined types were placed in this encounter.   No orders of the defined types were placed in this encounter.     F/U  Return in about 2 weeks (around 09/10/2022).  Elonda Husky, M.D. 08/27/2022 3:43 PM

## 2022-08-27 NOTE — Progress Notes (Signed)
Patient presents today for a skin tag removal. She states this has been an ongoing issue over the past 3 months and reports it is very irritating.
